# Patient Record
Sex: Female | Born: 1995 | Race: Black or African American | Hispanic: No | Marital: Single | Smoking: Never smoker
Health system: Southern US, Community
[De-identification: ages and names within clinical notes are randomized; demographics above are authoritative.]

## PROBLEM LIST (undated history)

## (undated) ENCOUNTER — Inpatient Hospital Stay (HOSPITAL_COMMUNITY): Payer: Self-pay

## (undated) DIAGNOSIS — E0590 Thyrotoxicosis, unspecified without thyrotoxic crisis or storm: Secondary | ICD-10-CM

## (undated) DIAGNOSIS — N390 Urinary tract infection, site not specified: Secondary | ICD-10-CM

## (undated) DIAGNOSIS — E785 Hyperlipidemia, unspecified: Secondary | ICD-10-CM

## (undated) DIAGNOSIS — I959 Hypotension, unspecified: Secondary | ICD-10-CM

## (undated) DIAGNOSIS — F32A Depression, unspecified: Secondary | ICD-10-CM

## (undated) DIAGNOSIS — R87629 Unspecified abnormal cytological findings in specimens from vagina: Secondary | ICD-10-CM

## (undated) HISTORY — DX: Hyperlipidemia, unspecified: E78.5

## (undated) HISTORY — PX: NO PAST SURGERIES: SHX2092

## (undated) HISTORY — DX: Thyrotoxicosis, unspecified without thyrotoxic crisis or storm: E05.90

## (undated) HISTORY — PX: DILATION AND CURETTAGE OF UTERUS: SHX78

---

## 2016-11-05 ENCOUNTER — Emergency Department (HOSPITAL_BASED_OUTPATIENT_CLINIC_OR_DEPARTMENT_OTHER)
Admission: EM | Admit: 2016-11-05 | Discharge: 2016-11-06 | Disposition: A | Discharge status: Home / Self Care | Payer: Managed Care, Other (non HMO) | Attending: Emergency Medicine | Admitting: Emergency Medicine

## 2016-11-05 ENCOUNTER — Emergency Department (HOSPITAL_BASED_OUTPATIENT_CLINIC_OR_DEPARTMENT_OTHER): Payer: Managed Care, Other (non HMO)

## 2016-11-05 ENCOUNTER — Encounter (HOSPITAL_BASED_OUTPATIENT_CLINIC_OR_DEPARTMENT_OTHER): Payer: Self-pay

## 2016-11-05 DIAGNOSIS — M546 Pain in thoracic spine: Secondary | ICD-10-CM | POA: Diagnosis not present

## 2016-11-05 DIAGNOSIS — S299XXA Unspecified injury of thorax, initial encounter: Secondary | ICD-10-CM | POA: Diagnosis present

## 2016-11-05 DIAGNOSIS — Y92410 Unspecified street and highway as the place of occurrence of the external cause: Secondary | ICD-10-CM | POA: Diagnosis not present

## 2016-11-05 DIAGNOSIS — Y9389 Activity, other specified: Secondary | ICD-10-CM | POA: Diagnosis not present

## 2016-11-05 DIAGNOSIS — Y999 Unspecified external cause status: Secondary | ICD-10-CM | POA: Insufficient documentation

## 2016-11-05 MED ORDER — IBUPROFEN 800 MG PO TABS
800.0000 mg | ORAL_TABLET | Freq: Three times a day (TID) | ORAL | 0 refills | Status: DC
Start: 2016-11-05 — End: 2017-11-06

## 2016-11-05 MED ORDER — METHOCARBAMOL 500 MG PO TABS: 500.0000 mg | ORAL_TABLET | Freq: Two times a day (BID) | ORAL | 0 refills | Status: DC
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
## 2017-10-04 ENCOUNTER — Ambulatory Visit (HOSPITAL_COMMUNITY)
Admission: EM | Admit: 2017-10-04 | Discharge: 2017-10-04 | Disposition: A | Discharge status: Home / Self Care | Payer: Managed Care, Other (non HMO)

## 2017-10-04 NOTE — ED Notes (Signed)
Called x 3 in waiting room no answer 

## 2017-11-06 ENCOUNTER — Encounter: Payer: Self-pay | Admitting: Certified Nurse Midwife

## 2017-11-06 ENCOUNTER — Encounter: Payer: Self-pay | Admitting: *Deleted

## 2017-11-06 ENCOUNTER — Ambulatory Visit (INDEPENDENT_AMBULATORY_CARE_PROVIDER_SITE_OTHER): Payer: Managed Care, Other (non HMO) | Admitting: Certified Nurse Midwife

## 2017-11-06 VITALS — BP 114/74 | HR 81 | Wt 188.0 lb

## 2017-11-06 DIAGNOSIS — Z3400 Encounter for supervision of normal first pregnancy, unspecified trimester: Secondary | ICD-10-CM | POA: Insufficient documentation

## 2017-11-06 DIAGNOSIS — Z113 Encounter for screening for infections with a predominantly sexual mode of transmission: Secondary | ICD-10-CM | POA: Diagnosis not present

## 2017-11-06 DIAGNOSIS — Z1151 Encounter for screening for human papillomavirus (HPV): Secondary | ICD-10-CM | POA: Diagnosis not present

## 2017-11-06 DIAGNOSIS — Z3401 Encounter for supervision of normal first pregnancy, first trimester: Secondary | ICD-10-CM

## 2017-11-06 DIAGNOSIS — Z124 Encounter for screening for malignant neoplasm of cervix: Secondary | ICD-10-CM

## 2017-11-06 DIAGNOSIS — O219 Vomiting of pregnancy, unspecified: Secondary | ICD-10-CM

## 2017-11-06 LAB — POCT URINALYSIS DIPSTICK
Bilirubin, UA: NEGATIVE
Blood, UA: NEGATIVE
Glucose, UA: NEGATIVE
Leukocytes, UA: NEGATIVE
Nitrite, UA: NEGATIVE
Spec Grav, UA: 1.01 (ref 1.010–1.025)
Urobilinogen, UA: 0.2 E.U./dL
pH, UA: 7 (ref 5.0–8.0)

## 2017-11-06 MED ORDER — DOXYLAMINE-PYRIDOXINE ER 20-20 MG PO TBCR: 1.0000 | EXTENDED_RELEASE_TABLET | Freq: Two times a day (BID) | ORAL | 6 refills | Status: DC

## 2017-11-06 NOTE — Progress Notes (Signed)
Pt complains of N&V, constipation.

## 2017-11-06 NOTE — Progress Notes (Signed)
Subjective:   Melony OverlyJanique Marrs is a 22 y.o. G1P0 at 6277w1d by LMP being seen today for her first obstetrical visit.  Her obstetrical history is significant for none. Patient does intend to breast feed. Pregnancy history fully reviewed.  Patient reports nausea, no bleeding, no contractions, no cramping, no leaking, vomiting and constipation.  HISTORY: Obstetric History   G1   P0   T0   P0   A0   L0    SAB0   TAB0   Ectopic0   Multiple0   Live Births0     # Outcome Date GA Lbr Len/2nd Weight Sex Delivery Anes PTL Lv  1 Current               Last pap smear was done unknown.    Past Medical History:  Diagnosis Date  . Hyperlipidemia    Past Surgical History:  Procedure Laterality Date  . NO PAST SURGERIES     History reviewed. No pertinent family history. Social History   Tobacco Use  . Smoking status: Never Smoker  . Smokeless tobacco: Never Used  Substance Use Topics  . Alcohol use: No    Frequency: Never  . Drug use: No   Allergies  Allergen Reactions  . Tamiflu [Oseltamivir Phosphate] Hives   No current outpatient medications on file prior to visit.   No current facility-administered medications on file prior to visit.     Review of Systems Pertinent items noted in HPI and remainder of comprehensive ROS otherwise negative.  Exam   Vitals:   11/06/17 1407  BP: 114/74  Pulse: 81  Weight: 188 lb (85.3 kg)      Uterus:     Pelvic Exam: Perineum: no hemorrhoids, normal perineum   Vulva: normal external genitalia, no lesions   Vagina:  normal mucosa, normal discharge   Cervix: no lesions and normal, pap smear done.    Adnexa: normal adnexa and no mass, fullness, tenderness   Bony Pelvis: average  System: General: well-developed, well-nourished female in no acute distress   Breast:  normal appearance, no masses or tenderness   Skin: normal coloration and turgor, no rashes   Neurologic: oriented, normal, negative, normal mood   Extremities: normal  strength, tone, and muscle mass, ROM of all joints is normal   HEENT PERRLA, extraocular movement intact and sclera clear, anicteric   Mouth/Teeth mucous membranes moist, pharynx normal without lesions and dental hygiene good   Neck supple and no masses   Cardiovascular: regular rate and rhythm   Respiratory:  no respiratory distress, normal breath sounds   Abdomen: soft, non-tender; bowel sounds normal; no masses,  no organomegaly     Assessment:   Pregnancy: G1P0 Patient Active Problem List   Diagnosis Date Noted  . Supervision of normal first pregnancy, antepartum 11/06/2017     Plan:  1. Supervision of normal first pregnancy, antepartum    - Cytology - PAP - Hemoglobinopathy evaluation - VITAMIN D 25 Hydroxy (Vit-D Deficiency, Fractures) - Culture, OB Urine - Hemoglobin A1c - Obstetric Panel, Including HIV - Cervicovaginal ancillary only - POCT urinalysis dipstick - Genetic Screening  2. Nausea/vomiting in pregnancy     - Doxylamine-Pyridoxine ER (BONJESTA) 20-20 MG TBCR; Take 1 tablet by mouth 2 (two) times daily.  Dispense: 60 tablet; Refill: 6   Initial labs drawn. Continue prenatal vitamins. Genetic Screening discussed, NIPS: ordered. Ultrasound discussed; fetal anatomic survey: ordered. Problem list reviewed and updated. The nature of [place] [facility] with multiple MDs and other Advanced Practice Providers was explained to patient; also emphasized that residents, students are part of our team. Routine obstetric precautions reviewed. Return in about 4 weeks (around 12/04/2017) for ROB.     Orvilla Cornwall, CNM [facility] for [employer], [facility]

## 2017-11-07 LAB — CERVICOVAGINAL ANCILLARY ONLY
Bacterial vaginitis: POSITIVE — AB
Candida vaginitis: POSITIVE — AB
Chlamydia: NEGATIVE
Neisseria Gonorrhea: NEGATIVE
Trichomonas: NEGATIVE

## 2017-11-08 ENCOUNTER — Other Ambulatory Visit: Payer: Self-pay | Admitting: Certified Nurse Midwife

## 2017-11-08 LAB — OBSTETRIC PANEL, INCLUDING HIV
Antibody Screen: NEGATIVE
Basophils Absolute: 0 10*3/uL (ref 0.0–0.2)
Basos: 1 %
EOS (ABSOLUTE): 0.1 10*3/uL (ref 0.0–0.4)
Eos: 2 %
HIV Screen 4th Generation wRfx: NONREACTIVE
Hematocrit: 36.9 % (ref 34.0–46.6)
Hemoglobin: 12.6 g/dL (ref 11.1–15.9)
Hepatitis B Surface Ag: NEGATIVE
Immature Grans (Abs): 0 10*3/uL (ref 0.0–0.1)
Immature Granulocytes: 0 %
Lymphocytes Absolute: 2.1 10*3/uL (ref 0.7–3.1)
Lymphs: 39 %
MCH: 31.8 pg (ref 26.6–33.0)
MCHC: 34.1 g/dL (ref 31.5–35.7)
MCV: 93 fL (ref 79–97)
Monocytes Absolute: 0.8 10*3/uL (ref 0.1–0.9)
Monocytes: 14 %
Neutrophils Absolute: 2.5 10*3/uL (ref 1.4–7.0)
Neutrophils: 44 %
Platelets: 249 10*3/uL (ref 150–379)
RBC: 3.96 x10E6/uL (ref 3.77–5.28)
RDW: 13.3 % (ref 12.3–15.4)
RPR Ser Ql: NONREACTIVE
Rh Factor: POSITIVE
Rubella Antibodies, IGG: <0.9 index — ABNORMAL LOW (ref >0.99)
WBC: 5.5 10*3/uL (ref 3.4–10.8)

## 2017-11-08 LAB — HEMOGLOBINOPATHY EVALUATION
HGB C: 0 %
HGB S: 0 %
HGB VARIANT: 0 %
Hemoglobin A2 Quantitation: 2.6 % (ref 1.8–3.2)
Hemoglobin F Quantitation: 0 % (ref 0.0–2.0)
Hgb A: 97.4 % (ref 96.4–98.8)

## 2017-11-08 LAB — CULTURE, OB URINE

## 2017-11-08 LAB — HEMOGLOBIN A1C
Est. average glucose Bld gHb Est-mCnc: 91 mg/dL
Hgb A1c MFr Bld: 4.8 % (ref 4.8–5.6)

## 2017-11-08 LAB — VITAMIN D 25 HYDROXY (VIT D DEFICIENCY, FRACTURES): Vit D, 25-Hydroxy: 12.1 ng/mL — ABNORMAL LOW (ref 30.0–100.0)

## 2017-11-08 LAB — URINE CULTURE, OB REFLEX

## 2017-11-09 LAB — CYTOLOGY - PAP
Diagnosis: NEGATIVE
HPV 16/18/45 genotyping: NEGATIVE
HPV: DETECTED — AB

## 2017-11-15 ENCOUNTER — Telehealth: Payer: Self-pay

## 2017-11-15 NOTE — Telephone Encounter (Signed)
Returned call and patient stated that she needed a new letter so that she would be able to go to clinical rotations, pt stated that she needs letter tomorrow and will try and find a fax number and call back.

## 2017-11-15 NOTE — Telephone Encounter (Signed)
Returned call, no answer, left vm 

## 2017-11-19 ENCOUNTER — Other Ambulatory Visit: Payer: Self-pay | Admitting: Certified Nurse Midwife

## 2017-11-19 DIAGNOSIS — B373 Candidiasis of vulva and vagina: Secondary | ICD-10-CM

## 2017-11-19 DIAGNOSIS — B9689 Other specified bacterial agents as the cause of diseases classified elsewhere: Secondary | ICD-10-CM

## 2017-11-19 DIAGNOSIS — E559 Vitamin D deficiency, unspecified: Secondary | ICD-10-CM | POA: Insufficient documentation

## 2017-11-19 DIAGNOSIS — N760 Acute vaginitis: Principal | ICD-10-CM

## 2017-11-19 DIAGNOSIS — Z283 Underimmunization status: Secondary | ICD-10-CM | POA: Insufficient documentation

## 2017-11-19 DIAGNOSIS — Z3400 Encounter for supervision of normal first pregnancy, unspecified trimester: Secondary | ICD-10-CM

## 2017-11-19 DIAGNOSIS — O9989 Other specified diseases and conditions complicating pregnancy, childbirth and the puerperium: Secondary | ICD-10-CM

## 2017-11-19 DIAGNOSIS — B3731 Acute candidiasis of vulva and vagina: Secondary | ICD-10-CM

## 2017-11-19 DIAGNOSIS — O09899 Supervision of other high risk pregnancies, unspecified trimester: Secondary | ICD-10-CM

## 2017-11-19 DIAGNOSIS — Z2839 Other underimmunization status: Secondary | ICD-10-CM | POA: Insufficient documentation

## 2017-11-19 MED ORDER — TERCONAZOLE 0.8 % VA CREA: 1.0000 | TOPICAL_CREAM | Freq: Every day | VAGINAL | 0 refills | Status: DC

## 2017-11-19 MED ORDER — METRONIDAZOLE 0.75 % VA GEL: 1.0000 | Freq: Two times a day (BID) | VAGINAL | 0 refills | Status: DC

## 2017-11-19 MED ORDER — VITAMIN D (ERGOCALCIFEROL) 1.25 MG (50000 UNIT) PO CAPS: 50000.0000 [IU] | ORAL_CAPSULE | ORAL | 2 refills | Status: DC

## 2017-12-04 ENCOUNTER — Ambulatory Visit (INDEPENDENT_AMBULATORY_CARE_PROVIDER_SITE_OTHER): Payer: Managed Care, Other (non HMO) | Admitting: Certified Nurse Midwife

## 2017-12-04 ENCOUNTER — Encounter: Payer: Self-pay | Admitting: Certified Nurse Midwife

## 2017-12-04 VITALS — BP 128/74 | HR 75 | Wt 185.0 lb

## 2017-12-04 DIAGNOSIS — Z2839 Other underimmunization status: Secondary | ICD-10-CM

## 2017-12-04 DIAGNOSIS — Z3402 Encounter for supervision of normal first pregnancy, second trimester: Secondary | ICD-10-CM

## 2017-12-04 DIAGNOSIS — Z283 Underimmunization status: Secondary | ICD-10-CM

## 2017-12-04 DIAGNOSIS — E559 Vitamin D deficiency, unspecified: Secondary | ICD-10-CM

## 2017-12-04 DIAGNOSIS — Z3400 Encounter for supervision of normal first pregnancy, unspecified trimester: Secondary | ICD-10-CM

## 2017-12-04 DIAGNOSIS — O9989 Other specified diseases and conditions complicating pregnancy, childbirth and the puerperium: Secondary | ICD-10-CM

## 2017-12-04 NOTE — Progress Notes (Signed)
   PRENATAL VISIT NOTE  Subjective:  Margaret Goodman is a 22 y.o. G1P0 at 109w1dbeing seen today for ongoing prenatal care.  She is currently monitored for the following issues for this low-risk pregnancy and has Supervision of normal first pregnancy, antepartum; Vitamin D deficiency; and Rubella non-immune status, antepartum on their problem list.  Patient reports no bleeding, no contractions, no cramping, no leaking and allergy symptoms: OTC medications discussed.  Contractions: Not present. Vag. Bleeding: None.  Movement: Absent. Denies leaking of fluid.   The following portions of the patient's history were reviewed and updated as appropriate: allergies, current medications, past family history, past medical history, past social history, past surgical history and problem list. Problem list updated.  Objective:   Vitals:   12/04/17 1459  BP: 128/74  Pulse: 75  Weight: 185 lb (83.9 kg)    Fetal Status: Fetal Heart Rate (bpm): 158; doppler   Movement: Absent     General:  Alert, oriented and cooperative. Patient is in no acute distress.  Skin: Skin is warm and dry. No rash noted.   Cardiovascular: Normal heart rate noted  Respiratory: Normal respiratory effort, no problems with respiration noted  Abdomen: Soft, gravid, appropriate for gestational age.  Pain/Pressure: Present     Pelvic: Cervical exam deferred        Extremities: Normal range of motion.  Edema: None  Mental Status: Normal mood and affect. Normal behavior. Normal judgment and thought content.   Assessment and Plan:  Pregnancy: G1P0 at 118w1d1. Supervision of normal first pregnancy, antepartum     Doing well.  Allergies: mild.  OTC medications discussed/list given.  Anatomy [place]ore[place] 12/31/17.  - AFP, Serum, Open Spina Bifida  2. Vitamin D deficiency     Taking weekly vitamin D  3. Rubella non-immune status, antepartum     MMR postpartum  Preterm labor symptoms and general obstetric precautions including but  not limited to vaginal bleeding, contractions, leaking of fluid and fetal movement were reviewed in detail with the patient. Please refer to After Visit Summary for other counseling recommendations.  Return in about 1 month (around 01/01/2018) for [place] Future Appointments  Date Time Provider [place]5/13/2019  2:15 PM [place] Alaynah Schutter, CNM

## 2017-12-04 NOTE — Progress Notes (Signed)
Pt states she has had episodes of feeling faint, dizzy possibly dehydrated.   Pt needs a different PNV's

## 2017-12-06 LAB — AFP, SERUM, OPEN SPINA BIFIDA
AFP MoM: 1.39
AFP Value: 35.6 ng/mL
Gest. Age on Collection Date: 15.1 weeks
Maternal Age At EDD: 22.6 yr
OSBR Risk 1 IN: 7407
Test Results:: NEGATIVE
Weight: 185 [lb_av]

## 2017-12-11 ENCOUNTER — Encounter: Payer: Self-pay | Admitting: *Deleted

## 2017-12-11 ENCOUNTER — Other Ambulatory Visit: Payer: Self-pay | Admitting: Certified Nurse Midwife

## 2017-12-11 DIAGNOSIS — Z3400 Encounter for supervision of normal first pregnancy, unspecified trimester: Secondary | ICD-10-CM

## 2017-12-31 ENCOUNTER — Other Ambulatory Visit: Payer: Self-pay | Admitting: Certified Nurse Midwife

## 2017-12-31 ENCOUNTER — Ambulatory Visit (HOSPITAL_COMMUNITY)
Admission: RE | Admit: 2017-12-31 | Discharge: 2017-12-31 | Disposition: A | Discharge status: Home / Self Care | Payer: Managed Care, Other (non HMO) | Source: Ambulatory Visit | Attending: Certified Nurse Midwife | Admitting: Certified Nurse Midwife

## 2017-12-31 DIAGNOSIS — Z3A19 19 weeks gestation of pregnancy: Secondary | ICD-10-CM

## 2017-12-31 DIAGNOSIS — Z3400 Encounter for supervision of normal first pregnancy, unspecified trimester: Secondary | ICD-10-CM

## 2017-12-31 DIAGNOSIS — Z363 Encounter for antenatal screening for malformations: Secondary | ICD-10-CM

## 2018-01-01 ENCOUNTER — Other Ambulatory Visit: Payer: Self-pay | Admitting: Certified Nurse Midwife

## 2018-01-01 DIAGNOSIS — Z3400 Encounter for supervision of normal first pregnancy, unspecified trimester: Secondary | ICD-10-CM

## 2018-01-04 ENCOUNTER — Encounter: Payer: Managed Care, Other (non HMO) | Admitting: Certified Nurse Midwife

## 2018-01-11 ENCOUNTER — Encounter: Payer: Self-pay | Admitting: Certified Nurse Midwife

## 2018-01-11 ENCOUNTER — Ambulatory Visit (INDEPENDENT_AMBULATORY_CARE_PROVIDER_SITE_OTHER): Payer: Managed Care, Other (non HMO) | Admitting: Certified Nurse Midwife

## 2018-01-11 VITALS — BP 105/67 | HR 71 | Wt 191.0 lb

## 2018-01-11 DIAGNOSIS — O9989 Other specified diseases and conditions complicating pregnancy, childbirth and the puerperium: Secondary | ICD-10-CM

## 2018-01-11 DIAGNOSIS — Z283 Underimmunization status: Secondary | ICD-10-CM

## 2018-01-11 DIAGNOSIS — Z2839 Other underimmunization status: Secondary | ICD-10-CM

## 2018-01-11 DIAGNOSIS — Z3402 Encounter for supervision of normal first pregnancy, second trimester: Secondary | ICD-10-CM

## 2018-01-11 DIAGNOSIS — O09899 Supervision of other high risk pregnancies, unspecified trimester: Secondary | ICD-10-CM

## 2018-01-11 DIAGNOSIS — E559 Vitamin D deficiency, unspecified: Secondary | ICD-10-CM

## 2018-01-11 DIAGNOSIS — Z3400 Encounter for supervision of normal first pregnancy, unspecified trimester: Secondary | ICD-10-CM

## 2018-01-11 NOTE — Progress Notes (Signed)
Patient reports fetal movement, denies pain. 

## 2018-01-11 NOTE — Progress Notes (Signed)
   PRENATAL VISIT NOTE  Subjective:  Margaret Goodman is a 22 y.o. G1P0 at 20w4dbeing seen today for ongoing prenatal care.  She is currently monitored for the following issues for this low-risk pregnancy and has Supervision of normal first pregnancy, antepartum; Vitamin D deficiency; and Rubella non-immune status, antepartum on their problem list.  Patient reports no complaints.  Contractions: Not present. Vag. Bleeding: None.  Movement: Present. Denies leaking of fluid.   The following portions of the patient's history were reviewed and updated as appropriate: allergies, current medications, past family history, past medical history, past social history, past surgical history and problem list. Problem list updated.  Objective:   Vitals:   01/11/18 0825  BP: 105/67  Pulse: 71  Weight: 191 lb (86.6 kg)    Fetal Status: Fetal Heart Rate (bpm): 152; doppler Fundal Height: 20 cm Movement: Present     General:  Alert, oriented and cooperative. Patient is in no acute distress.  Skin: Skin is warm and dry. No rash noted.   Cardiovascular: Normal heart rate noted  Respiratory: Normal respiratory effort, no problems with respiration noted  Abdomen: Soft, gravid, appropriate for gestational age.  Pain/Pressure: Absent     Pelvic: Cervical exam deferred        Extremities: Normal range of motion.  Edema: None  Mental Status: Normal mood and affect. Normal behavior. Normal judgment and thought content.   Assessment and Plan:  Pregnancy: G1P0 at 281w4d1. Supervision of normal first pregnancy, antepartum     Doing well.  Has f/u anatomy scan scheduled for 02/01/18.    2. Vitamin D deficiency     Taking weekly vitamin D  3. Rubella non-immune status, antepartum     MMR postpartum  Preterm labor symptoms and general obstetric precautions including but not limited to vaginal bleeding, contractions, leaking of fluid and fetal movement were reviewed in detail with the patient. Please refer to  After Visit Summary for other counseling recommendations.  Return in about 1 month (around 02/08/2018) for [place] Future Appointments  Date Time Provider [place]6/14/2019  3:15 PM [facility] [place] Labrina Lines, CNM

## 2018-02-01 ENCOUNTER — Ambulatory Visit (HOSPITAL_COMMUNITY)
Admission: RE | Admit: 2018-02-01 | Discharge: 2018-02-01 | Disposition: A | Discharge status: Home / Self Care | Payer: Managed Care, Other (non HMO) | Source: Ambulatory Visit | Attending: Certified Nurse Midwife | Admitting: Certified Nurse Midwife

## 2018-02-01 DIAGNOSIS — Z3A23 23 weeks gestation of pregnancy: Secondary | ICD-10-CM | POA: Insufficient documentation

## 2018-02-01 DIAGNOSIS — Z3402 Encounter for supervision of normal first pregnancy, second trimester: Secondary | ICD-10-CM | POA: Diagnosis not present

## 2018-02-01 DIAGNOSIS — Z3400 Encounter for supervision of normal first pregnancy, unspecified trimester: Secondary | ICD-10-CM

## 2018-02-05 ENCOUNTER — Other Ambulatory Visit: Payer: Self-pay | Admitting: Certified Nurse Midwife

## 2018-02-05 DIAGNOSIS — Z3400 Encounter for supervision of normal first pregnancy, unspecified trimester: Secondary | ICD-10-CM

## 2018-02-08 ENCOUNTER — Encounter: Payer: Managed Care, Other (non HMO) | Admitting: Certified Nurse Midwife

## 2018-02-15 ENCOUNTER — Ambulatory Visit (INDEPENDENT_AMBULATORY_CARE_PROVIDER_SITE_OTHER): Payer: Managed Care, Other (non HMO) | Admitting: Certified Nurse Midwife

## 2018-02-15 ENCOUNTER — Encounter: Payer: Self-pay | Admitting: Certified Nurse Midwife

## 2018-02-15 VITALS — BP 120/75 | HR 68 | Wt 194.2 lb

## 2018-02-15 DIAGNOSIS — Z3400 Encounter for supervision of normal first pregnancy, unspecified trimester: Secondary | ICD-10-CM

## 2018-02-15 DIAGNOSIS — O9989 Other specified diseases and conditions complicating pregnancy, childbirth and the puerperium: Secondary | ICD-10-CM

## 2018-02-15 DIAGNOSIS — M25559 Pain in unspecified hip: Secondary | ICD-10-CM

## 2018-02-15 DIAGNOSIS — O99612 Diseases of the digestive system complicating pregnancy, second trimester: Secondary | ICD-10-CM

## 2018-02-15 DIAGNOSIS — Z2839 Other underimmunization status: Secondary | ICD-10-CM

## 2018-02-15 DIAGNOSIS — K219 Gastro-esophageal reflux disease without esophagitis: Secondary | ICD-10-CM

## 2018-02-15 DIAGNOSIS — O26892 Other specified pregnancy related conditions, second trimester: Secondary | ICD-10-CM

## 2018-02-15 DIAGNOSIS — Z3402 Encounter for supervision of normal first pregnancy, second trimester: Secondary | ICD-10-CM

## 2018-02-15 DIAGNOSIS — Z283 Underimmunization status: Secondary | ICD-10-CM

## 2018-02-15 DIAGNOSIS — E559 Vitamin D deficiency, unspecified: Secondary | ICD-10-CM

## 2018-02-15 MED ORDER — VITAMIN D (ERGOCALCIFEROL) 1.25 MG (50000 UNIT) PO CAPS: 50000.0000 [IU] | ORAL_CAPSULE | ORAL | 2 refills | Status: DC

## 2018-02-15 MED ORDER — COMFORT FIT MATERNITY SUPP LG MISC: 1.0000 [IU] | Freq: Every day | 0 refills | Status: DC

## 2018-02-15 MED ORDER — OMEPRAZOLE 20 MG PO CPDR: 20.0000 mg | DELAYED_RELEASE_CAPSULE | Freq: Two times a day (BID) | ORAL | 5 refills | Status: DC

## 2018-02-15 NOTE — Progress Notes (Signed)
   PRENATAL VISIT NOTE  Subjective:  Margaret Goodman is a 22 y.o. G1P0 at 48w4dbeing seen today for ongoing prenatal care.  She is currently monitored for the following issues for this low-risk pregnancy and has Supervision of normal first pregnancy, antepartum; Vitamin D deficiency; and Rubella non-immune status, antepartum on their problem list.  Patient reports backache, no bleeding, no contractions, no cramping, no leaking and left hip pain is worse with working.  Contractions: Not present. Vag. Bleeding: None.  Movement: Present. Denies leaking of fluid.   The following portions of the patient's history were reviewed and updated as appropriate: allergies, current medications, past family history, past medical history, past social history, past surgical history and problem list. Problem list updated.  Objective:   Vitals:   02/15/18 0901  BP: 120/75  Pulse: 68  Weight: 194 lb 3.2 oz (88.1 kg)    Fetal Status: Fetal Heart Rate (bpm): 143; doppler Fundal Height: 24 cm Movement: Present     General:  Alert, oriented and cooperative. Patient is in no acute distress.  Skin: Skin is warm and dry. No rash noted.   Cardiovascular: Normal heart rate noted  Respiratory: Normal respiratory effort, no problems with respiration noted  Abdomen: Soft, gravid, appropriate for gestational age.  Pain/Pressure: Present     Pelvic: Cervical exam deferred        Extremities: Normal range of motion.  Edema: None  Mental Status: Normal mood and affect. Normal behavior. Normal judgment and thought content.   Assessment and Plan:  Pregnancy: G1P0 at 242w4d1. Supervision of normal first pregnancy, antepartum     Left hip pain, Homeopathic remedies discussed  2. Rubella non-immune status, antepartum     MMR postpartum  3. Vitamin D deficiency      - Vitamin D, Ergocalciferol, (DRISDOL) 50000 units CAPS capsule; Take 1 capsule (50,000 Units total) by mouth every 7 (seven) days.  Dispense: 30  capsule; Refill: 2  4. Pregnancy related hip pain in second trimester, antepartum      - Elastic Bandages & Supports (COMFORT FIT MATERNITY SUPP LG) MISC; 1 Units by Does not apply route daily.  Dispense: 1 each; Refill: 0  5. Gastroesophageal reflux during pregnancy in second trimester, antepartum     OTC TUMS recommended, diet discussed.  - omeprazole (PRILOSEC) 20 MG capsule; Take 1 capsule (20 mg total) by mouth 2 (two) times daily before a meal.  Dispense: 60 capsule; Refill: 5  Preterm labor symptoms and general obstetric precautions including but not limited to vaginal bleeding, contractions, leaking of fluid and fetal movement were reviewed in detail with the patient. Please refer to After Visit Summary for other counseling recommendations.  Return in about 2 weeks (around 03/01/2018) for ROB, 2 hr OGTT.  Future Appointments  Date Time Provider [place]7/07/2018  8:00 AM [facility] None  03/01/2018  8:30 AM [place], TeRona RavensNP CWM[place]    RaMorene CrockerCNM

## 2018-03-01 ENCOUNTER — Ambulatory Visit (INDEPENDENT_AMBULATORY_CARE_PROVIDER_SITE_OTHER): Payer: Managed Care, Other (non HMO) | Admitting: Nurse Practitioner

## 2018-03-01 ENCOUNTER — Other Ambulatory Visit: Payer: Managed Care, Other (non HMO)

## 2018-03-01 DIAGNOSIS — Z3403 Encounter for supervision of normal first pregnancy, third trimester: Secondary | ICD-10-CM

## 2018-03-01 DIAGNOSIS — Z3400 Encounter for supervision of normal first pregnancy, unspecified trimester: Secondary | ICD-10-CM

## 2018-03-01 MED ORDER — VITAFOL GUMMIES 3.33-0.333-34.8 MG PO CHEW: 3.0000 | CHEWABLE_TABLET | Freq: Every day | ORAL | 11 refills | Status: DC

## 2018-03-01 NOTE — Progress Notes (Signed)
    Subjective:  Margaret Goodman is a 22 y.o. G1P0 at 2739w4d being seen today for ongoing prenatal care.  She is currently monitored for the following issues for this low-risk pregnancy and has Supervision of normal first pregnancy, antepartum; Vitamin D deficiency; and Rubella non-immune status, antepartum on their problem list.  Patient reports no complaints.  Contractions: Irregular. Vag. Bleeding: None.  Movement: Present. Denies leaking of fluid.   The following portions of the patient's history were reviewed and updated as appropriate: allergies, current medications, past family history, past medical history, past social history, past surgical history and problem list. Problem list updated.  Objective:   Vitals:   03/01/18 0825  BP: 99/65  Pulse: 92  Weight: 198 lb 1.6 oz (89.9 kg)    Fetal Status: Fetal Heart Rate (bpm): 145; doppler Fundal Height: 29 cm Movement: Present     General:  Alert, oriented and cooperative. Patient is in no acute distress.  Skin: Skin is warm and dry. No rash noted.   Cardiovascular: Normal heart rate noted  Respiratory: Normal respiratory effort, no problems with respiration noted  Abdomen: Soft, gravid, appropriate for gestational age. Pain/Pressure: Present     Pelvic:  Cervical exam deferred        Extremities: Normal range of motion.  Edema: None  Mental Status: Normal mood and affect. Normal behavior. Normal judgment and thought content.   Urinalysis:      Assessment and Plan:  Pregnancy: G1P0 at 6839w4d  1. Supervision of normal first pregnancy, antepartum Hip pain has improved but is still present at night and she has to move from side to side in the bed.  Advised to use a pillow between her knees at night. Advised to sign up for Baby Scripts App for information and to sign up for childbirth and breastfeeding classes. Taking meds for reflux and it is being managed well. Prescribed prescription gummy PNV.  Currently taking over the  counter.  - Glucose Tolerance, 2 Hours w/1 Hour - CBC - HIV antibody - RPR  Preterm labor symptoms and general obstetric precautions including but not limited to vaginal bleeding, contractions, leaking of fluid and fetal movement were reviewed in detail with the patient. Please refer to After Visit Summary for other counseling recommendations.  Return in about 2 weeks (around 03/15/2018).  Nolene BernheimERRI Genetta Fiero, RN, MSN, NP-BC Nurse Practitioner, [facility] [employer], [facility] 03/01/2018 8:53 AM

## 2018-03-01 NOTE — Patient Instructions (Addendum)
Childbirth Education Options: [facility] Department Classes:  Childbirth education classes can help you get ready for a positive parenting experience. You can also meet other expectant parents and get free stuff for your baby. Each class runs for five weeks on the same night and costs $45 for the mother-to-be and her support person. Medicaid covers the cost if you are eligible. Call [phone] to register. [facility] Childbirth Education:  [phone] or [phone] or [email or web address]  Baby & Me Class: Discuss newborn & infant parenting and family adjustment issues with other new mothers in a relaxed environment. Each week brings a new speaker or baby-centered activity. We encourage new mothers to join [place] every Thursday at 11:00am. Babies birth until crawling. No registration or fee. Daddy [employer]: This course offers Dads-to-be the tools and knowledge needed to feel confident on their journey to becoming new fathers. Experienced dads, who have been trained as coaches, teach dads-to-be how to hold, comfort, diaper, swaddle and play with their infant while being able to support the new mom as well. A class for men taught by men. $25/dad Big Brother/Big Sister: Let your children share in the joy of a new brother or sister in this special class designed just for them. Class includes discussion about how families care for babies: swaddling, holding, diapering, safety as well as how they can be helpful in their new role. This class is designed for children ages 45 to 48, but any age is welcome. Please register each child individually. $5/child  Mom Talk: This mom-led group offers support and connection to mothers as they journey through the adjustments and struggles of that sometimes overwhelming first year after the birth of a child. Tuesdays at 10:00am and Thursdays at 6:00pm. Babies welcome. No registration or fee. Breastfeeding Support Group: This group is a mother-to-mother  support circle where moms have the opportunity to share their breastfeeding experiences. A Lactation Consultant is present for questions and concerns. Meets each Tuesday at 11:00am. No fee or registration. Breastfeeding Your Baby: Learn what to expect in the first days of breastfeeding your newborn.  This class will help you feel more confident with the skills needed to begin your breastfeeding experience. Many new mothers are concerned about breastfeeding after leaving the hospital. This class will also address the most common fears and challenges about breastfeeding during the first few weeks, months and beyond. (call for fee) Comfort Techniques and Tour: This 2 hour interactive class will provide you the opportunity to learn & practice hands-on techniques that can help relieve some of the discomfort of labor and encourage your baby to rotate toward the best position for birth. You and your partner will be able to try a variety of labor positions with birth balls and rebozos as well as practice breathing, relaxation, and visualization techniques. A tour of the [facility] is included with this class. $20 per registrant and support person Childbirth Class- Weekend Option: This class is a Weekend version of our Birth & Baby series. It is designed for parents who have a difficult time fitting several weeks of classes into their schedule. It covers the care of your newborn and the basics of labor and childbirth. It also includes a [place] of [facility] and lunch. The class is held two consecutive days: beginning on Friday evening from 6:30 - 8:30 p.m. and the next day, Saturday from 9 a.m. - 4 p.m. (call for fee) Doren Custard Class: Interested in a waterbirth?  This  informational class will help you discover whether waterbirth is the right fit for you. Education about waterbirth itself, supplies you would need and how to assemble your support team is what you can  expect from this class. Some obstetrical practices require this class in order to pursue a waterbirth. (Not all obstetrical practices offer waterbirth-check with your healthcare provider.) Register only the expectant mom, but you are encouraged to bring your partner to class! Required if planning waterbirth, no fee. Infant/Child CPR: Parents, grandparents, babysitters, and friends learn Cardio-Pulmonary Resuscitation skills for infants and children. You will also learn how to treat both conscious and unconscious choking in infants and children. This Family & Friends program does not offer certification. Register each participant individually to ensure that enough mannequins are available. (Call for fee) Grandparent Love: Expecting a grandbaby? This class is for you! Learn about the latest infant care and safety recommendations and ways to support your own child as he or she transitions into the parenting role. Taught by Registered Nurses who are childbirth instructors, but most importantly...they are grandmothers too! $10/person. Childbirth Class- Natural Childbirth: This series of 5 weekly classes is for expectant parents who want to learn and practice natural methods of coping with the process of labor and childbirth. Relaxation, breathing, massage, visualization, role of the partner, and helpful positioning are highlighted. Participants learn how to be confident in their body's ability to give birth. This class will empower and help parents make informed decisions about their own care. Includes discussion that will help new parents transition into the immediate postpartum period. Maternity Care Center Tour of [facility] is included. We suggest taking this class between 25-32 weeks, but it's only a recommendation. $75 per registrant and one support person or $30 Medicaid. Childbirth Class- 3 week Series: This option of 3 weekly classes helps you and your labor partner prepare for childbirth. Newborn  care, labor & birth, cesarean birth, pain management, and comfort techniques are discussed and a Maternity Care Center Tour of [facility] is included. The class meets at the same time, on the same day of the week for 3 consecutive weeks beginning with the starting date you choose. $60 for registrant and one support person.  Marvelous Multiples: Expecting twins, triplets, or more? This class covers the differences in labor, birth, parenting, and breastfeeding issues that face multiples' parents. NICU tour is included. Led by a Certified Childbirth Educator who is the mother of twins. No fee. Caring for Baby: This class is for expectant and adoptive parents who want to learn and practice the most up-to-date newborn care for their babies. Focus is on birth through the first six weeks of life. Topics include feeding, bathing, diapering, crying, umbilical cord care, circumcision care and safe sleep. Parents learn to recognize symptoms of illness and when to call the pediatrician. Register only the mom-to-be and your partner or support person can plan to come with you! $10 per registrant and support person Childbirth Class- online option: This online class offers you the freedom to complete a Birth and Baby series in the comfort of your own home. The flexibility of this option allows you to review sections at your own pace, at times convenient to you and your support people. It includes additional video information, animations, quizzes, and extended activities. Get organized with helpful eClass tools, checklists, and trackers. Once you register online for the class, you will receive an email within a few days to accept the invitation and begin the class when the time   is right for you. The content will be available to you for 60 days. $60 for 60 days of online access for you and your support people.  Local Doulas: Natural Baby Doulas [email or web address] Tel:  [phone] [email or web address] [employer] [phone] [email or web address] The Labor Hassell Halim  (also do waterbirth tub rental) [phone] [email or web address] [email or web address] Birth Doula [phone] [email or web address] Sacred Rhythms  [phone] [email or web address] [employer] (PADA) [email or web address] [place] Birth and Baby  [email or web address] Considering Waterbirth? Guide for patients at [facility] [employer]  Why consider waterbirth?  . Gentle birth for babies . Less pain medicine used in labor . May allow for passive descent/less pushing . May reduce perineal tears  . More mobility and instinctive maternal position changes . Increased maternal relaxation . Reduced blood pressure in labor  Is waterbirth safe? What are the risks of infection, drowning or other complications?  . Infection: o Very low risk (3.7 % for tub vs 4.8% for bed) o 7 in 8000 waterbirths with documented infection o Poorly cleaned equipment most common cause o Slightly lower group B strep transmission rate  . Drowning o Maternal:  - Very low risk   - Related to seizures or fainting o Newborn:  - Very low risk. No evidence of increased risk of respiratory problems in multiple large studies - Physiological protection from breathing under water - Avoid underwater birth if there are any fetal complications - Once baby's head is out of the water, keep it out.  . Birth complication o Some reports of cord trauma, but risk decreased by bringing baby to surface gradually o No evidence of increased risk of shoulder dystocia. Mothers can usually change positions faster in water than in a bed, possibly aiding the maneuvers to free the shoulder.   You must attend a Doren Custard class at [facility]  3rd Wednesday of every month from 7-9pm  [employer] by calling [phone] or online at [email or web address]  Bring [place] the certificate from the class to your prenatal appointment  Meet with a midwife at 36 weeks to see if you can still plan a waterbirth and to sign the consent.   Purchase or rent the following supplies:   Water Birth Pool (Birth Pool in a Box or [place] for instance)  (Tubs start ~$125)  Single-use disposable tub liner designed for your brand of tub  New garden hose labeled "lead-free", "suitable for drinking water",  Electric drain pump to remove water (We recommend 792 gallon per hour or greater pump.)   Separate garden hose to remove the dirty water  Fish net  Bathing suit top (optional)  Long-handled mirror (optional)  Places to purchase or rent supplies  [email or web address] for tub purchases and supplies  Waterbirthsolutions.com for tub purchases and supplies  The Labor Ladies ([email or web address]) $275 for tub rental/set-up & take down/kit   [employer] (http://www.fleming.[email or web address]) Information regarding doulas (labor support) who provide pool rentals  Our practice has a Birth Pool in a Box tub at the hospital that you may borrow on a first-come-first-served basis. It is your responsibility to to set up, clean and break down the tub. We cannot guarantee the availability of this tub in advance. You are responsible for bringing all accessories listed above. If you do not have all necessary supplies you cannot have a waterbirth.    Things that would prevent you from having a waterbirth:  Premature, <37wks  Previous cesarean birth  Presence of thick meconium-stained fluid  Multiple gestation (Twins,  triplets, etc.)  Uncontrolled diabetes or gestational diabetes requiring medication  Hypertension requiring medication or diagnosis of pre-eclampsia  Heavy vaginal bleeding  Non-reassuring fetal  heart rate  Active infection (MRSA, etc.). Group B Strep is NOT a contraindication for  waterbirth.  If your labor has to be induced and induction method requires continuous  monitoring of the baby's heart rate  Other risks/issues identified by your obstetrical provider  Please remember that birth is unpredictable. Under certain unforeseeable circumstances your provider may advise against giving birth in the tub. These decisions will be made on a case-by-case basis and with the safety of you and your baby as our highest priority.  BENEFITS OF BREASTFEEDING Many women wonder if they should breastfeed. Research shows that breast milk contains the perfect balance of vitamins, protein and fat that your baby needs to grow. It also contains antibodies that help your baby's immune system to fight off viruses and bacteria and can reduce the risk of sudden infant death syndrome (SIDS). In addition, the colostrum (a fluid secreted from the breast in the first few days after delivery) helps your newborn's digestive system to grow and function well. Breast milk is easier to digest than formula. Also, if your baby is born preterm, breast milk can help to reduce both short- and long-term health problems. BENEFITS OF BREASTFEEDING FOR MOM . Breastfeeding causes a hormone to be released that helps the uterus to contract and return to its normal size more quickly. . It aids in postpartum weight loss, reduces risk of breast and ovarian cancer, heart disease and rheumatoid arthritis. . It decreases the amount of bleeding after the baby is born. benefits of breastfeeding for baby . Provides comfort and nutrition . Protects baby against - Obesity - Diabetes - Asthma - Childhood cancers - Heart disease - Ear infections - Diarrhea - Pneumonia - Stomach problems - Serious allergies - Skin rashes . Promotes growth and development . Reduces the risk of baby having Sudden Infant Death Syndrome (SIDS) only  breastmilk for the first 6 months . Protects baby against diseases/allergies . It's the perfect amount for tiny bellies . It restores baby's energy . Provides the best nutrition for baby . Giving water or formula can make baby more likely to get sick, decrease Mom's milk supply, make baby less content with breastfeeding Skin to Skin After delivery, the staff will place your baby on your chest. This helps with the following: . Regulates baby's temperature, breathing, heart rate and blood sugar . Increases Mom's milk supply . Promotes bonding . Keeps baby and Mom calm and decreases baby's crying Rooming In Your baby will stay in your room with you for the entire time you are in the hospital. This helps with the following: . Allows Mom to learn baby's feeding cues - Fluttering eyes - Sucking on tongue or hand - Rooting (opens mouth and turns head) - Nuzzling into the breast - Bringing hand to mouth . Allows breastfeeding on demand (when your baby is ready) . Helps baby to be calm and content . Ensures a good milk supply . Prevents complications with breastfeeding . Allows parents to learn to care for baby . Allows you to request assistance with breastfeeding Importance of a good latch . Increases milk transfer to baby - baby gets enough milk . Ensures you have enough milk for your baby . Decreases nipple soreness . Don't use pacifiers and bottles - these cause baby to suck differently than breastfeeding . Promotes continuation of breastfeeding   Risks of Formula Supplementation with Breastfeeding Giving your infant formula in addition to your breast-milk EXCEPT when medically necessary can lead to: . Decreases your milk supply  . Loss of confidence in yourself for providing baby's nutrition  . Engorgement and possibly mastitis  . Asthma & allergies in the baby BREASTFEEDING FAQS How long should I breastfeed my baby? It is recommended that you provide your baby with breast milk only  for the first 6 months and then continue for the first year and longer as desired. During the first few weeks after birth, your baby will need to feed 8-12 times every 24 hours, or every 2-3 hours. They will likely feed for 15-30 minutes. How can I help my baby begin breastfeeding? Babies are born with an instinct to breastfeed. A healthy baby can begin breastfeeding right away without specific help. At the hospital, a nurse (or lactation consultant) will help you begin the process and will give you tips on good positioning. It may be helpful to take a breastfeeding class before you deliver in order to know what to expect. How can I help my baby latch on? In order to assist your baby in latching-on, cup your breast in your hand and stroke your baby's lower lip with your nipple to stimulate your baby's rooting reflex. Your baby will look like he or she is yawning, at which point you should bring the baby towards your breast, while aiming the nipple at the roof of his or her mouth. Remember to bring the baby towards you and not your breast towards the baby. How can I tell if my baby is latched-on? Your baby will have all of your nipple and part of the dark area around the nipple in his or her mouth and your baby's nose will be touching your breast. You should see or hear the baby swallowing. If the baby is not latched-on properly, start the process over. To remove the suction, insert a clean finger between your breast and the baby's mouth. Should I switch breasts during feeding? After feeding on one side, switch the baby to your other breast. If he or she does not continue feeding - that is OK. Your baby will not necessarily need to feed from both breasts in a single feeding. On the next feeding, start with the other breast for efficiency and comfort. How can I tell if my baby is hungry? When your baby is hungry, they will nuzzle against your breast, make sucking noises and tongue motions and may put their  hands near their mouth. Crying is a late sign of hunger, so you should not wait until this point. When they have received enough milk, they will unlatch from the breast. Is it okay to use a pacifier? Until your baby gets the hang of breastfeeding, experts recommend limiting pacifier usage. If you have questions about this, please contact your pediatrician. What can I do to ensure proper nutrition while breastfeeding? . Make sure that you support your own health and your baby's by eating a healthy, well-balanced diet . Your provider may recommend that you continue to take your prenatal vitamin . Drink plenty of fluids. It is a good rule to drink one glass of water before or after feeding . Alcohol will remain in the breast milk for as long as it will remain in the blood stream. If you choose to have a drink, it is recommended that you wait at least 2 hours before feeding . Moderate amounts of caffeine are   are OK . Some over-the-counter or prescription medications are not recommended during breastfeeding. Check with your provider if you have questions What types of birth control methods are safe while breastfeeding? Progestin-only methods, including a daily pill, an IUD, the implant and the injection are safe while breastfeeding. Methods that contain estrogen (such as combination birth control pills, the vaginal ring and the patch) should not be used during the first month of breastfeeding as these can decrease your milk supply.    Third Trimester of Pregnancy The third trimester is from week 29 through week 42, months 7 through 9. This trimester is when your unborn baby (fetus) is growing very fast. At the end of the ninth month, the unborn baby is about 20 inches in length. It weighs about 6-10 pounds. Follow these instructions at home:  Avoid all smoking, herbs, and alcohol. Avoid drugs not approved by your doctor.  Do not use any tobacco products, including cigarettes, chewing tobacco, and  electronic cigarettes. If you need help quitting, ask your doctor. You may get counseling or other support to help you quit.  Only take medicine as told by your doctor. Some medicines are safe and some are not during pregnancy.  Exercise only as told by your doctor. Stop exercising if you start having cramps.  Eat regular, healthy meals.  Wear a good support bra if your breasts are tender.  Do not use hot tubs, steam rooms, or saunas.  Wear your seat belt when driving.  Avoid raw meat, uncooked cheese, and liter boxes and soil used by cats.  Take your prenatal vitamins.  Take 1500-2000 milligrams of calcium daily starting at the 20th week of pregnancy until you deliver your baby.  Try taking medicine that helps you poop (stool softener) as needed, and if your doctor approves. Eat more fiber by eating fresh fruit, vegetables, and whole grains. Drink enough fluids to keep your pee (urine) clear or pale yellow.  Take warm water baths (sitz baths) to soothe pain or discomfort caused by hemorrhoids. Use hemorrhoid cream if your doctor approves.  If you have puffy, bulging veins (varicose veins), wear support hose. Raise (elevate) your feet for 15 minutes, 3-4 times a day. Limit salt in your diet.  Avoid heavy lifting, wear low heels, and sit up straight.  Rest with your legs raised if you have leg cramps or low back pain.  Visit your dentist if you have not gone during your pregnancy. Use a soft toothbrush to brush your teeth. Be gentle when you floss.  You can have sex (intercourse) unless your doctor tells you not to.  Do not travel far distances unless you must. Only do so with your doctor's approval.  Take prenatal classes.  Practice driving to the hospital.  Pack your hospital bag.  Prepare the baby's room.  Go to your doctor visits. Get help if:  You are not sure if you are in labor or if your water has broken.  You are dizzy.  You have mild cramps or pressure in  your lower belly (abdominal).  You have a nagging pain in your belly area.  You continue to feel sick to your stomach (nauseous), throw up (vomit), or have watery poop (diarrhea).  You have bad smelling fluid coming from your vagina.  You have pain with peeing (urination). Get help right away if:  You have a fever.  You are leaking fluid from your vagina.  You are spotting or bleeding from your vagina.  You have  severe belly cramping or pain.  You lose or gain weight rapidly.  You have trouble catching your breath and have chest pain.  You notice sudden or extreme puffiness (swelling) of your face, hands, ankles, feet, or legs.  You have not felt the baby move in over an hour.  You have severe headaches that do not go away with medicine.  You have vision changes. This information is not intended to replace advice given to you by your health care provider. Make sure you discuss any questions you have with your health care provider. Document Released: 11/01/2009 Document Revised: 01/13/2016 Document Reviewed: 10/08/2012 Elsevier Interactive Patient Education  2017 [employer].

## 2018-03-01 NOTE — Addendum Note (Signed)
Addended by: Natale MilchSTALLING, Addisson Frate D on: 03/01/2018 09:20 AM   Modules accepted: Orders

## 2018-03-02 LAB — CBC
Hematocrit: 32 % — ABNORMAL LOW (ref 34.0–46.6)
Hemoglobin: 10.5 g/dL — ABNORMAL LOW (ref 11.1–15.9)
MCH: 31.3 pg (ref 26.6–33.0)
MCHC: 32.8 g/dL (ref 31.5–35.7)
MCV: 95 fL (ref 79–97)
Platelets: 219 10*3/uL (ref 150–450)
RBC: 3.36 x10E6/uL — ABNORMAL LOW (ref 3.77–5.28)
RDW: 13.9 % (ref 12.3–15.4)
WBC: 6.6 10*3/uL (ref 3.4–10.8)

## 2018-03-02 LAB — GLUCOSE TOLERANCE, 2 HOURS W/ 1HR
Glucose, 1 hour: 99 mg/dL (ref 65–179)
Glucose, 2 hour: 81 mg/dL (ref 65–152)
Glucose, Fasting: 71 mg/dL (ref 65–91)

## 2018-03-02 LAB — RPR: RPR Ser Ql: NONREACTIVE

## 2018-03-02 LAB — HIV ANTIBODY (ROUTINE TESTING W REFLEX): HIV Screen 4th Generation wRfx: NONREACTIVE

## 2018-03-15 ENCOUNTER — Ambulatory Visit (INDEPENDENT_AMBULATORY_CARE_PROVIDER_SITE_OTHER): Payer: Managed Care, Other (non HMO) | Admitting: Nurse Practitioner

## 2018-03-15 ENCOUNTER — Other Ambulatory Visit: Payer: Self-pay

## 2018-03-15 VITALS — BP 113/69 | HR 90 | Wt 200.0 lb

## 2018-03-15 DIAGNOSIS — B3731 Acute candidiasis of vulva and vagina: Secondary | ICD-10-CM

## 2018-03-15 DIAGNOSIS — Z3400 Encounter for supervision of normal first pregnancy, unspecified trimester: Secondary | ICD-10-CM

## 2018-03-15 DIAGNOSIS — Z3403 Encounter for supervision of normal first pregnancy, third trimester: Secondary | ICD-10-CM

## 2018-03-15 DIAGNOSIS — B373 Candidiasis of vulva and vagina: Secondary | ICD-10-CM

## 2018-03-15 MED ORDER — COMFORT FIT MATERNITY SUPP LG MISC: 0 refills | Status: DC

## 2018-03-15 MED ORDER — TERCONAZOLE 0.4 % VA CREA: 1.0000 | TOPICAL_CREAM | Freq: Every day | VAGINAL | 0 refills | Status: DC

## 2018-03-15 NOTE — Patient Instructions (Signed)

## 2018-03-15 NOTE — Progress Notes (Signed)
    Subjective:  Margaret Goodman is a 22 y.o. G1P0 at 5953w4d being seen today for ongoing prenatal care.  She is currently monitored for the following issues for this low-risk pregnancy and has Supervision of normal first pregnancy, antepartum; Vitamin D deficiency; and Rubella non-immune status, antepartum on their problem list.  Patient reports irritation of gential area..  Has been leaving off underwear at night.  Used A&D ointment in the groin area - that seems to be where it started.  Burns after bathing. Contractions: Not present. Vag. Bleeding: None.  Movement: Present. Denies leaking of fluid.   The following portions of the patient's history were reviewed and updated as appropriate: allergies, current medications, past family history, past medical history, past social history, past surgical history and problem list. Problem list updated.  Objective:   Vitals:   03/15/18 0819  BP: 113/69  Pulse: 90  Weight: 200 lb (90.7 kg)    Fetal Status: Fetal Heart Rate (bpm): 148 Fundal Height: 31 cm Movement: Present     General:  Alert, oriented and cooperative. Patient is in no acute distress.  Skin: Skin is warm and dry. No rash noted.   Cardiovascular: Normal heart rate noted  Respiratory: Normal respiratory effort, no problems with respiration noted  Abdomen: Soft, gravid, appropriate for gestational age. Pain/Pressure: Present     Pelvic:  Cervical exam deferred      Visual inspection of genital area shows hypopigmentation of labia bilaterally and clitoris.  Does not extend to groin.  Extremities: Normal range of motion.  Edema: None  Mental Status: Normal mood and affect. Normal behavior. Normal judgment and thought content.     Assessment and Plan:  Pregnancy: G1P0 at 8053w4d  1. Supervision of normal first pregnancy, antepartum Has not yet signed up for childbirth classes but is planning to register.  Is taking Prilosec and it is helping.  Did not get belly support band and has  lost the prescription - will reprint.  Hip pain has improved.  Baby is moving lots.  2. Yeast infection involving the vagina and surrounding area Terazol cream prescribed with instructions to use internally but especially externally at least daily and more if it helps the area feel better.  Preterm labor symptoms and general obstetric precautions including but not limited to vaginal bleeding, contractions, leaking of fluid and fetal movement were reviewed in detail with the patient. Please refer to After Visit Summary for other counseling recommendations.  Return in about 2 weeks (around 03/29/2018).  Nolene BernheimERRI Keyleigh Manninen, RN, MSN, NP-BC Nurse Practitioner, [facility], [facility] 03/15/2018 8:37 AM

## 2018-03-15 NOTE — Progress Notes (Signed)
ROB. C/o sweating, burning, itching and irritation in groin x 1 1/2 week.
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
## 2018-03-29 ENCOUNTER — Ambulatory Visit (INDEPENDENT_AMBULATORY_CARE_PROVIDER_SITE_OTHER): Payer: Managed Care, Other (non HMO) | Admitting: Advanced Practice Midwife

## 2018-03-29 DIAGNOSIS — Z3400 Encounter for supervision of normal first pregnancy, unspecified trimester: Secondary | ICD-10-CM

## 2018-03-29 NOTE — Patient Instructions (Signed)
Third Trimester of Pregnancy The third trimester is from week 28 through week 40 (months 7 through 9). The third trimester is a time when the unborn baby (fetus) is growing rapidly. At the end of the ninth month, the fetus is about 20 inches in length and weighs 6-10 pounds. Body changes during your third trimester Your body will continue to go through many changes during pregnancy. The changes vary from woman to woman. During the third trimester:  Your weight will continue to increase. You can expect to gain 25-35 pounds (11-16 kg) by the end of the pregnancy.  You may begin to get stretch marks on your hips, abdomen, and breasts.  You may urinate more often because the fetus is moving lower into your pelvis and pressing on your bladder.  You may develop or continue to have heartburn. This is caused by increased hormones that slow down muscles in the digestive tract.  You may develop or continue to have constipation because increased hormones slow digestion and cause the muscles that push waste through your intestines to relax.  You may develop hemorrhoids. These are swollen veins (varicose veins) in the rectum that can itch or be painful.  You may develop swollen, bulging veins (varicose veins) in your legs.  You may have increased body aches in the pelvis, back, or thighs. This is due to weight gain and increased hormones that are relaxing your joints.  You may have changes in your hair. These can include thickening of your hair, rapid growth, and changes in texture. Some women also have hair loss during or after pregnancy, or hair that feels dry or thin. Your hair will most likely return to normal after your baby is born.  Your breasts will continue to grow and they will continue to become tender. A yellow fluid (colostrum) may leak from your breasts. This is the first milk you are producing for your baby.  Your belly button may stick out.  You may notice more swelling in your hands,  face, or ankles.  You may have increased tingling or numbness in your hands, arms, and legs. The skin on your belly may also feel numb.  You may feel short of breath because of your expanding uterus.  You may have more problems sleeping. This can be caused by the size of your belly, increased need to urinate, and an increase in your body's metabolism.  You may notice the fetus "dropping," or moving lower in your abdomen (lightening).  You may have increased vaginal discharge.  You may notice your joints feel loose and you may have pain around your pelvic bone.  What to expect at prenatal visits You will have prenatal exams every 2 weeks until week 36. Then you will have weekly prenatal exams. During a routine prenatal visit:  You will be weighed to make sure you and the baby are growing normally.  Your blood pressure will be taken.  Your abdomen will be measured to track your baby's growth.  The fetal heartbeat will be listened to.  Any test results from the previous visit will be discussed.  You may have a cervical check near your due date to see if your cervix has softened or thinned (effaced).  You will be tested for Group B streptococcus. This happens between 35 and 37 weeks.  Your health care provider may ask you:  What your birth plan is.  How you are feeling.  If you are feeling the baby move.  If you have had   any abnormal symptoms, such as leaking fluid, bleeding, severe headaches, or abdominal cramping.  If you are using any tobacco products, including cigarettes, chewing tobacco, and electronic cigarettes.  If you have any questions.  Other tests or screenings that may be performed during your third trimester include:  Blood tests that check for low iron levels (anemia).  Fetal testing to check the health, activity level, and growth of the fetus. Testing is done if you have certain medical conditions or if there are problems during the  pregnancy.  Nonstress test (NST). This test checks the health of your baby to make sure there are no signs of problems, such as the baby not getting enough oxygen. During this test, a belt is placed around your belly. The baby is made to move, and its heart rate is monitored during movement.  What is false labor? False labor is a condition in which you feel small, irregular tightenings of the muscles in the womb (contractions) that usually go away with rest, changing position, or drinking water. These are called Braxton Hicks contractions. Contractions may last for hours, days, or even weeks before true labor sets in. If contractions come at regular intervals, become more frequent, increase in intensity, or become painful, you should see your health care provider. What are the signs of labor?  Abdominal cramps.  Regular contractions that start at 10 minutes apart and become stronger and more frequent with time.  Contractions that start on the top of the uterus and spread down to the lower abdomen and back.  Increased pelvic pressure and dull back pain.  A watery or bloody mucus discharge that comes from the vagina.  Leaking of amniotic fluid. This is also known as your "water breaking." It could be a slow trickle or a gush. Let your health care provider know if it has a color or strange odor. If you have any of these signs, call your health care provider right away, even if it is before your due date. Follow these instructions at home: Medicines  Follow your health care provider's instructions regarding medicine use. Specific medicines may be either safe or unsafe to take during pregnancy.  Take a prenatal vitamin that contains at least 600 micrograms (mcg) of folic acid.  If you develop constipation, try taking a stool softener if your health care provider approves. Eating and drinking  Eat a balanced diet that includes fresh fruits and vegetables, whole grains, good sources of protein  such as meat, eggs, or tofu, and low-fat dairy. Your health care provider will help you determine the amount of weight gain that is right for you.  Avoid raw meat and uncooked cheese. These carry germs that can cause birth defects in the baby.  If you have low calcium intake from food, talk to your health care provider about whether you should take a daily calcium supplement.  Eat four or five small meals rather than three large meals a day.  Limit foods that are high in fat and processed sugars, such as fried and sweet foods.  To prevent constipation: ? Drink enough fluid to keep your urine clear or pale yellow. ? Eat foods that are high in fiber, such as fresh fruits and vegetables, whole grains, and beans. Activity  Exercise only as directed by your health care provider. Most women can continue their usual exercise routine during pregnancy. Try to exercise for 30 minutes at least 5 days a week. Stop exercising if you experience uterine contractions.  Avoid heavy   lifting.  Do not exercise in extreme heat or humidity, or at high altitudes.  Wear low-heel, comfortable shoes.  Practice good posture.  You may continue to have sex unless your health care provider tells you otherwise. Relieving pain and discomfort  Take frequent breaks and rest with your legs elevated if you have leg cramps or low back pain.  Take warm sitz baths to soothe any pain or discomfort caused by hemorrhoids. Use hemorrhoid cream if your health care provider approves.  Wear a good support bra to prevent discomfort from breast tenderness.  If you develop varicose veins: ? Wear support pantyhose or compression stockings as told by your healthcare provider. ? Elevate your feet for 15 minutes, 3-4 times a day. Prenatal care  Write down your questions. Take them to your prenatal visits.  Keep all your prenatal visits as told by your health care provider. This is important. Safety  Wear your seat belt at  all times when driving.  Make a list of emergency phone numbers, including numbers for family, friends, the hospital, and police and fire departments. General instructions  Avoid cat litter boxes and soil used by cats. These carry germs that can cause birth defects in the baby. If you have a cat, ask someone to clean the litter box for you.  Do not travel far distances unless it is absolutely necessary and only with the approval of your health care provider.  Do not use hot tubs, steam rooms, or saunas.  Do not drink alcohol.  Do not use any products that contain nicotine or tobacco, such as cigarettes and e-cigarettes. If you need help quitting, ask your health care provider.  Do not use any medicinal herbs or unprescribed drugs. These chemicals affect the formation and growth of the baby.  Do not douche or use tampons or scented sanitary pads.  Do not cross your legs for long periods of time.  To prepare for the arrival of your baby: ? Take prenatal classes to understand, practice, and ask questions about labor and delivery. ? Make a trial run to the hospital. ? Visit the hospital and tour the maternity area. ? Arrange for maternity or paternity leave through employers. ? Arrange for family and friends to take care of pets while you are in the hospital. ? Purchase a rear-facing car seat and make sure you know how to install it in your car. ? Pack your hospital bag. ? Prepare the baby's nursery. Make sure to remove all pillows and stuffed animals from the baby's crib to prevent suffocation.  Visit your dentist if you have not gone during your pregnancy. Use a soft toothbrush to brush your teeth and be gentle when you floss. Contact a health care provider if:  You are unsure if you are in labor or if your water has broken.  You become dizzy.  You have mild pelvic cramps, pelvic pressure, or nagging pain in your abdominal area.  You have lower back pain.  You have persistent  nausea, vomiting, or diarrhea.  You have an unusual or bad smelling vaginal discharge.  You have pain when you urinate. Get help right away if:  Your water breaks before 37 weeks.  You have regular contractions less than 5 minutes apart before 37 weeks.  You have a fever.  You are leaking fluid from your vagina.  You have spotting or bleeding from your vagina.  You have severe abdominal pain or cramping.  You have rapid weight loss or weight gain.    You have shortness of breath with chest pain.  You notice sudden or extreme swelling of your face, hands, ankles, feet, or legs.  Your baby makes fewer than 10 movements in 2 hours.  You have severe headaches that do not go away when you take medicine.  You have vision changes. Summary  The third trimester is from week 28 through week 40, months 7 through 9. The third trimester is a time when the unborn baby (fetus) is growing rapidly.  During the third trimester, your discomfort may increase as you and your baby continue to gain weight. You may have abdominal, leg, and back pain, sleeping problems, and an increased need to urinate.  During the third trimester your breasts will keep growing and they will continue to become tender. A yellow fluid (colostrum) may leak from your breasts. This is the first milk you are producing for your baby.  False labor is a condition in which you feel small, irregular tightenings of the muscles in the womb (contractions) that eventually go away. These are called Braxton Hicks contractions. Contractions may last for hours, days, or even weeks before true labor sets in.  Signs of labor can include: abdominal cramps; regular contractions that start at 10 minutes apart and become stronger and more frequent with time; watery or bloody mucus discharge that comes from the vagina; increased pelvic pressure and dull back pain; and leaking of amniotic fluid. This information is not intended to replace advice  given to you by your health care provider. Make sure you discuss any questions you have with your health care provider. Document Released: 08/01/2001 Document Revised: 01/13/2016 Document Reviewed: 10/08/2012 Elsevier Interactive Patient Education  2017 [employer]  

## 2018-03-29 NOTE — Progress Notes (Signed)
Pt is G1P0 1066w4d here for ROB. No concerns today.

## 2018-03-29 NOTE — Progress Notes (Signed)
   PRENATAL VISIT NOTE  Subjective:  Margaret Goodman is a 22 y.o. G1P0 at 378w4d being seen today for ongoing prenatal care.  She is currently monitored for the following issues for this low-risk pregnancy and has Supervision of normal first pregnancy, antepartum; Vitamin D deficiency; and Rubella non-immune status, antepartum on their problem list.  Patient reports no complaints.  Contractions: Not present. Vag. Bleeding: None.  Movement: Present. Denies leaking of fluid.   The following portions of the patient's history were reviewed and updated as appropriate: allergies, current medications, past family history, past medical history, past social history, past surgical history and problem list. Problem list updated.  Objective:   Vitals:   03/29/18 0811  BP: 116/70  Pulse: 79  Weight: 90.8 kg    Fetal Status: Fetal Heart Rate (bpm): 145   Movement: Present     General:  Alert, oriented and cooperative. Patient is in no acute distress.  Skin: Skin is warm and dry. No rash noted.   Cardiovascular: Normal heart rate noted  Respiratory: Normal respiratory effort, no problems with respiration noted  Abdomen: Soft, gravid, appropriate for gestational age.  Pain/Pressure: Present     Pelvic: Cervical exam deferred        Extremities: Normal range of motion.  Edema: None  Mental Status: Normal mood and affect. Normal behavior. Normal judgment and thought content.   Assessment and Plan:  Pregnancy: G1P0 at 808w4d  1. Supervision of normal first pregnancy, antepartum --Anticipatory guidance about next visits/weeks of pregnancy given.  Preterm labor symptoms and general obstetric precautions including but not limited to vaginal bleeding, contractions, leaking of fluid and fetal movement were reviewed in detail with the patient. Please refer to After Visit Summary for other counseling recommendations.  Return in about 2 weeks (around 04/12/2018).  Future Appointments  Date Time Provider  Department Center  04/12/2018  9:50 AM Kooistra, Charlesetta GaribaldiKathryn Lorraine, CNM [facility] None    Sharen CounterLisa Leftwich-Kirby, CNM

## 2018-04-12 ENCOUNTER — Ambulatory Visit (INDEPENDENT_AMBULATORY_CARE_PROVIDER_SITE_OTHER): Payer: Self-pay | Admitting: Pediatrics

## 2018-04-12 ENCOUNTER — Ambulatory Visit (INDEPENDENT_AMBULATORY_CARE_PROVIDER_SITE_OTHER): Payer: Managed Care, Other (non HMO) | Admitting: Student

## 2018-04-12 DIAGNOSIS — Z3400 Encounter for supervision of normal first pregnancy, unspecified trimester: Secondary | ICD-10-CM

## 2018-04-12 DIAGNOSIS — Z7681 Expectant parent(s) prebirth pediatrician visit: Secondary | ICD-10-CM

## 2018-04-12 DIAGNOSIS — Z3481 Encounter for supervision of other normal pregnancy, first trimester: Secondary | ICD-10-CM

## 2018-04-12 NOTE — Patient Instructions (Signed)

## 2018-04-12 NOTE — Progress Notes (Signed)
Prenatal counseling for impending newborn done--1st child, currently 33wks, no complications, prenatal started 12wks Z76.81

## 2018-04-12 NOTE — Progress Notes (Signed)
   PRENATAL VISIT NOTE  Subjective:  Margaret Goodman is a 22 y.o. G1P0 at 5179w4d being seen today for ongoing prenatal care.  She is currently monitored for the following issues for this low-risk pregnancy and has Supervision of normal first pregnancy, antepartum; Vitamin D deficiency; and Rubella non-immune status, antepartum on their problem list.  Patient reports no complaints other than dizziness when she gets overheated. States she is only drinking 2-3 glasses of water a day but she is drinking powerade and tea during the day.  Contractions: Irritability. Vag. Bleeding: None.  Movement: Present. Denies leaking of fluid.   The following portions of the patient's history were reviewed and updated as appropriate: allergies, current medications, past family history, past medical history, past social history, past surgical history and problem list. Problem list updated.  Objective:   Vitals:   04/12/18 1002  BP: 119/78  Pulse: 96  Weight: 205 lb 4.8 oz (93.1 kg)    Fetal Status: Fetal Heart Rate (bpm): 145 Fundal Height: 33 cm Movement: Present     General:  Alert, oriented and cooperative. Patient is in no acute distress.  Skin: Skin is warm and dry. No rash noted.   Cardiovascular: Normal heart rate noted  Respiratory: Normal respiratory effort, no problems with respiration noted  Abdomen: Soft, gravid, appropriate for gestational age.  Pain/Pressure: Present     Pelvic: Cervical exam deferred        Extremities: Normal range of motion.  Edema: None  Mental Status: Normal mood and affect. Normal behavior. Normal judgment and thought content.   Assessment and Plan:  Pregnancy: G1P0 at 8079w4d  1. Supervision of normal first pregnancy, antepartum -Discussed drinking more water, rest, eating frequent snacks.   Preterm labor symptoms and general obstetric precautions including but not limited to vaginal bleeding, contractions, leaking of fluid and fetal movement were reviewed in detail  with the patient. Please refer to After Visit Summary for other counseling recommendations.  Return in about 2 weeks (around 04/26/2018), or LROB.  Future Appointments  Date Time Provider Department Center  04/12/2018 12:15 PM Myles GipAgbuya, Perry Scott, DO [facility]    Charlesetta GaribaldiKathryn Lorraine [place], [place]

## 2018-04-16 DIAGNOSIS — Z3481 Encounter for supervision of other normal pregnancy, first trimester: Secondary | ICD-10-CM

## 2018-04-24 ENCOUNTER — Ambulatory Visit (INDEPENDENT_AMBULATORY_CARE_PROVIDER_SITE_OTHER): Payer: Managed Care, Other (non HMO) | Admitting: Family Medicine

## 2018-04-24 DIAGNOSIS — Z3400 Encounter for supervision of normal first pregnancy, unspecified trimester: Secondary | ICD-10-CM

## 2018-04-24 NOTE — Patient Instructions (Signed)

## 2018-04-24 NOTE — Progress Notes (Signed)
    PRENATAL VISIT NOTE  Subjective:  Margaret Goodman is a 22 y.o. G1P0 at [redacted]w[redacted]d being seen today for ongoing prenatal care.  She is currently monitored for the following issues for this low-risk pregnancy and has Supervision of normal first pregnancy, antepartum; Vitamin D deficiency; and Rubella non-immune status, antepartum on their problem list.  Patient reports no complaints.  Contractions: Irritability. Vag. Bleeding: None.  Movement: Present. Denies leaking of fluid.   The following portions of the patient's history were reviewed and updated as appropriate: allergies, current medications, past family history, past medical history, past social history, past surgical history and problem list. Problem list updated.  Objective:   Vitals:   04/24/18 1553  BP: 126/76  Pulse: (!) 102  Weight: 211 lb 3.2 oz (95.8 kg)    Fetal Status: Fetal Heart Rate (bpm): 145 Fundal Height: 35 cm Movement: Present     General:  Alert, oriented and cooperative. Patient is in no acute distress.  Skin: Skin is warm and dry. No rash noted.   Cardiovascular: Normal heart rate noted  Respiratory: Normal respiratory effort, no problems with respiration noted  Abdomen: Soft, gravid, appropriate for gestational age.  Pain/Pressure: Present     Pelvic: Cervical exam deferred        Extremities: Normal range of motion.  Edema: None  Mental Status: Normal mood and affect. Normal behavior. Normal judgment and thought content.   Assessment and Plan:  Pregnancy: G1P0 at [redacted]w[redacted]d  1. Supervision of normal first pregnancy, antepartum Continue routine prenatal care. Declined flu  Preterm labor symptoms and general obstetric precautions including but not limited to vaginal bleeding, contractions, leaking of fluid and fetal movement were reviewed in detail with the patient. Please refer to After Visit Summary for other counseling recommendations.  Return in 1 week (on 05/01/2018).  Future Appointments  Date Time  Provider Department Center  05/03/2018 10:00 AM Brock Bad, MD [facility] None  05/10/2018  8:10 AM Calvert Cantor, CNM [facility] None    Reva Bores, MD

## 2018-04-24 NOTE — Progress Notes (Signed)
ROB.  Declined FLU.

## 2018-05-02 ENCOUNTER — Encounter: Payer: Managed Care, Other (non HMO) | Admitting: Obstetrics

## 2018-05-03 ENCOUNTER — Encounter: Payer: Self-pay | Admitting: Obstetrics

## 2018-05-03 ENCOUNTER — Ambulatory Visit (INDEPENDENT_AMBULATORY_CARE_PROVIDER_SITE_OTHER): Payer: Managed Care, Other (non HMO) | Admitting: Obstetrics

## 2018-05-03 ENCOUNTER — Other Ambulatory Visit (HOSPITAL_COMMUNITY)
Admission: RE | Admit: 2018-05-03 | Discharge: 2018-05-03 | Disposition: A | Discharge status: Home / Self Care | Payer: Managed Care, Other (non HMO) | Source: Ambulatory Visit | Attending: Obstetrics | Admitting: Obstetrics

## 2018-05-03 VITALS — BP 94/43 | HR 90 | Wt 207.0 lb

## 2018-05-03 DIAGNOSIS — N898 Other specified noninflammatory disorders of vagina: Secondary | ICD-10-CM | POA: Diagnosis not present

## 2018-05-03 DIAGNOSIS — Z3403 Encounter for supervision of normal first pregnancy, third trimester: Secondary | ICD-10-CM

## 2018-05-03 DIAGNOSIS — Z3400 Encounter for supervision of normal first pregnancy, unspecified trimester: Secondary | ICD-10-CM

## 2018-05-03 NOTE — Progress Notes (Signed)
Pt presents for ROB without c/o today. GBS/GC/CT due today.

## 2018-05-03 NOTE — Progress Notes (Signed)
Subjective:  Margaret OverlyJanique Goodman is a 22 y.o. G1P0 at 10064w4d being seen today for ongoing prenatal care.  She is currently monitored for the following issues for this low-risk pregnancy and has Supervision of normal first pregnancy, antepartum; Vitamin D deficiency; and Rubella non-immune status, antepartum on their problem list.  Patient reports no complaints.  Contractions: Irregular. Vag. Bleeding: None.  Movement: Present. Denies leaking of fluid.   The following portions of the patient's history were reviewed and updated as appropriate: allergies, current medications, past family history, past medical history, past social history, past surgical history and problem list. Problem list updated.  Objective:   Vitals:   05/03/18 1034  BP: (!) 94/43  Pulse: 90  Weight: 207 lb (93.9 kg)    Fetal Status: Fetal Heart Rate (bpm): 140   Movement: Present     General:  Alert, oriented and cooperative. Patient is in no acute distress.  Skin: Skin is warm and dry. No rash noted.   Cardiovascular: Normal heart rate noted  Respiratory: Normal respiratory effort, no problems with respiration noted  Abdomen: Soft, gravid, appropriate for gestational age. Pain/Pressure: Present     Pelvic:  Cervical exam deferred        Extremities: Normal range of motion.  Edema: Trace  Mental Status: Normal mood and affect. Normal behavior. Normal judgment and thought content.   Urinalysis:     X: Assessment and Plan:  Pregnancy: G1P0 at 2764w4d  1. Supervision of normal first pregnancy, antepartum Rx: - Culture, beta strep (group b only)  2. Vaginal discharge Rx: - Cervicovaginal ancillary only  Preterm labor symptoms and general obstetric precautions including but not limited to vaginal bleeding, contractions, leaking of fluid and fetal movement were reviewed in detail with the patient. Please refer to After Visit Summary for other counseling recommendations.  Return in about 1 week (around 05/10/2018) for  ROB.   Brock BadHarper, Jahmani Staup A, MD

## 2018-05-06 ENCOUNTER — Other Ambulatory Visit: Payer: Self-pay | Admitting: Obstetrics

## 2018-05-06 DIAGNOSIS — B3731 Acute candidiasis of vulva and vagina: Secondary | ICD-10-CM

## 2018-05-06 DIAGNOSIS — B9689 Other specified bacterial agents as the cause of diseases classified elsewhere: Secondary | ICD-10-CM

## 2018-05-06 DIAGNOSIS — N760 Acute vaginitis: Secondary | ICD-10-CM

## 2018-05-06 DIAGNOSIS — B373 Candidiasis of vulva and vagina: Secondary | ICD-10-CM

## 2018-05-06 LAB — CERVICOVAGINAL ANCILLARY ONLY
Bacterial vaginitis: POSITIVE — AB
Candida vaginitis: POSITIVE — AB
Chlamydia: NEGATIVE
Neisseria Gonorrhea: NEGATIVE
Trichomonas: NEGATIVE

## 2018-05-06 MED ORDER — TERCONAZOLE 0.8 % VA CREA
1.0000 | TOPICAL_CREAM | Freq: Every day | VAGINAL | 0 refills | Status: DC
Start: 2018-05-06 — End: 2018-05-17

## 2018-05-06 MED ORDER — TINIDAZOLE 500 MG PO TABS: 1000.0000 mg | ORAL_TABLET | Freq: Every day | ORAL | 2 refills | Status: DC

## 2018-05-07 LAB — CULTURE, BETA STREP (GROUP B ONLY): Strep Gp B Culture: NEGATIVE

## 2018-05-10 ENCOUNTER — Ambulatory Visit (INDEPENDENT_AMBULATORY_CARE_PROVIDER_SITE_OTHER): Payer: Managed Care, Other (non HMO) | Admitting: Advanced Practice Midwife

## 2018-05-10 VITALS — BP 109/71 | HR 89 | Wt 213.4 lb

## 2018-05-10 DIAGNOSIS — Z3400 Encounter for supervision of normal first pregnancy, unspecified trimester: Secondary | ICD-10-CM

## 2018-05-10 DIAGNOSIS — O09899 Supervision of other high risk pregnancies, unspecified trimester: Secondary | ICD-10-CM

## 2018-05-10 DIAGNOSIS — O9989 Other specified diseases and conditions complicating pregnancy, childbirth and the puerperium: Secondary | ICD-10-CM

## 2018-05-10 DIAGNOSIS — Z2839 Other underimmunization status: Secondary | ICD-10-CM

## 2018-05-10 DIAGNOSIS — Z283 Underimmunization status: Secondary | ICD-10-CM

## 2018-05-10 DIAGNOSIS — Z3403 Encounter for supervision of normal first pregnancy, third trimester: Secondary | ICD-10-CM

## 2018-05-10 NOTE — Patient Instructions (Signed)
Vaginal Delivery Vaginal delivery means that you will give birth by pushing your baby out of your birth canal (vagina). A team of health care providers will help you before, during, and after vaginal delivery. Birth experiences are unique for every woman and every pregnancy, and birth experiences vary depending on where you choose to give birth. What should I do to prepare for my baby's birth? Before your baby is born, it is important to talk with your health care provider about:  Your labor and delivery preferences. These may include: ? Medicines that you may be given. ? How you will manage your pain. This might include non-medical pain relief techniques or injectable pain relief such as epidural analgesia. ? How you and your baby will be monitored during labor and delivery. ? Who may be in the labor and delivery room with you. ? Your feelings about surgical delivery of your baby (cesarean delivery, or C-section) if this becomes necessary. ? Your feelings about receiving donated blood through an IV tube (blood transfusion) if this becomes necessary.  Whether you are able: ? To take pictures or videos of the birth. ? To eat during labor and delivery. ? To move around, walk, or change positions during labor and delivery.  What to expect after your baby is born, such as: ? Whether delayed umbilical cord clamping and cutting is offered. ? Who will care for your baby right after birth. ? Medicines or tests that may be recommended for your baby. ? Whether breastfeeding is supported in your hospital or birth center. ? How long you will be in the hospital or birth center.  How any medical conditions you have may affect your baby or your labor and delivery experience.  To prepare for your baby's birth, you should also:  Attend all of your health care visits before delivery (prenatal visits) as recommended by your health care provider. This is important.  Prepare your home for your baby's  arrival. Make sure that you have: ? Diapers. ? Baby clothing. ? Feeding equipment. ? Safe sleeping arrangements for you and your baby.  Install a car seat in your vehicle. Have your car seat checked by a certified car seat installer to make sure that it is installed safely.  Think about who will help you with your new baby at home for at least the first several weeks after delivery.  What can I expect when I arrive at the birth center or hospital? Once you are in labor and have been admitted into the hospital or birth center, your health care provider may:  Review your pregnancy history and any concerns you have.  Insert an IV tube into one of your veins. This is used to give you fluids and medicines.  Check your blood pressure, pulse, temperature, and heart rate (vital signs).  Check whether your bag of water (amniotic sac) has broken (ruptured).  Talk with you about your birth plan and discuss pain control options.  Monitoring Your health care provider may monitor your contractions (uterine monitoring) and your baby's heart rate (fetal monitoring). You may need to be monitored:  Often, but not continuously (intermittently).  All the time or for long periods at a time (continuously). Continuous monitoring may be needed if: ? You are taking certain medicines, such as medicine to relieve pain or make your contractions stronger. ? You have pregnancy or labor complications.  Monitoring may be done by:  Placing a special stethoscope or a handheld monitoring device on your abdomen to   check your baby's heartbeat, and feeling your abdomen for contractions. This method of monitoring does not continuously record your baby's heartbeat or your contractions.  Placing monitors on your abdomen (external monitors) to record your baby's heartbeat and the frequency and length of contractions. You may not have to wear external monitors all the time.  Placing monitors inside of your uterus  (internal monitors) to record your baby's heartbeat and the frequency, length, and strength of your contractions. ? Your health care provider may use internal monitors if he or she needs more information about the strength of your contractions or your baby's heart rate. ? Internal monitors are put in place by passing a thin, flexible wire through your vagina and into your uterus. Depending on the type of monitor, it may remain in your uterus or on your baby's head until birth. ? Your health care provider will discuss the benefits and risks of internal monitoring with you and will ask for your permission before inserting the monitors.  Telemetry. This is a type of continuous monitoring that can be done with external or internal monitors. Instead of having to stay in bed, you are able to move around during telemetry. Ask your health care provider if telemetry is an option for you.  Physical exam Your health care provider may perform a physical exam. This may include:  Checking whether your baby is positioned: ? With the head toward your vagina (head-down). This is most common. ? With the head toward the top of your uterus (head-up or breech). If your baby is in a breech position, your health care provider may try to turn your baby to a head-down position so you can deliver vaginally. If it does not seem that your baby can be born vaginally, your provider may recommend surgery to deliver your baby. In rare cases, you may be able to deliver vaginally if your baby is head-up (breech delivery). ? Lying sideways (transverse). Babies that are lying sideways cannot be delivered vaginally.  Checking your cervix to determine: ? Whether it is thinning out (effacing). ? Whether it is opening up (dilating). ? How low your baby has moved into your birth canal.  What are the three stages of labor and delivery?  Normal labor and delivery is divided into the following three stages: Stage 1  Stage 1 is the  longest stage of labor, and it can last for hours or days. Stage 1 includes: ? Early labor. This is when contractions may be irregular, or regular and mild. Generally, early labor contractions are more than 10 minutes apart. ? Active labor. This is when contractions get longer, more regular, more frequent, and more intense. ? The transition phase. This is when contractions happen very close together, are very intense, and may last longer than during any other part of labor.  Contractions generally feel mild, infrequent, and irregular at first. They get stronger, more frequent (about every 2-3 minutes), and more regular as you progress from early labor through active labor and transition.  Many women progress through stage 1 naturally, but you may need help to continue making progress. If this happens, your health care provider may talk with you about: ? Rupturing your amniotic sac if it has not ruptured yet. ? Giving you medicine to help make your contractions stronger and more frequent.  Stage 1 ends when your cervix is completely dilated to 4 inches (10 cm) and completely effaced. This happens at the end of the transition phase. Stage 2  Once   your cervix is completely effaced and dilated to 4 inches (10 cm), you may start to feel an urge to push. It is common for the body to naturally take a rest before feeling the urge to push, especially if you received an epidural or certain other pain medicines. This rest period may last for up to 1-2 hours, depending on your unique labor experience.  During stage 2, contractions are generally less painful, because pushing helps relieve contraction pain. Instead of contraction pain, you may feel stretching and burning pain, especially when the widest part of your baby's head passes through the vaginal opening (crowning).  Your health care provider will closely monitor your pushing progress and your baby's progress through the vagina during stage 2.  Your  health care provider may massage the area of skin between your vaginal opening and anus (perineum) or apply warm compresses to your perineum. This helps it stretch as the baby's head starts to crown, which can help prevent perineal tearing. ? In some cases, an incision may be made in your perineum (episiotomy) to allow the baby to pass through the vaginal opening. An episiotomy helps to make the opening of the vagina larger to allow more room for the baby to fit through.  It is very important to breathe and focus so your health care provider can control the delivery of your baby's head. Your health care provider may have you decrease the intensity of your pushing, to help prevent perineal tearing.  After delivery of your baby's head, the shoulders and the rest of the body generally deliver very quickly and without difficulty.  Once your baby is delivered, the umbilical cord may be cut right away, or this may be delayed for 1-2 minutes, depending on your baby's health. This may vary among health care providers, hospitals, and birth centers.  If you and your baby are healthy enough, your baby may be placed on your chest or abdomen to help maintain the baby's temperature and to help you bond with each other. Some mothers and babies start breastfeeding at this time. Your health care team will dry your baby and help keep your baby warm during this time.  Your baby may need immediate care if he or she: ? Showed signs of distress during labor. ? Has a medical condition. ? Was born too early (prematurely). ? Had a bowel movement before birth (meconium). ? Shows signs of difficulty transitioning from being inside the uterus to being outside of the uterus. If you are planning to breastfeed, your health care team will help you begin a feeding. Stage 3  The third stage of labor starts immediately after the birth of your baby and ends after you deliver the placenta. The placenta is an organ that develops  during pregnancy to provide oxygen and nutrients to your baby in the womb.  Delivering the placenta may require some pushing, and you may have mild contractions. Breastfeeding can stimulate contractions to help you deliver the placenta.  After the placenta is delivered, your uterus should tighten (contract) and become firm. This helps to stop bleeding in your uterus. To help your uterus contract and to control bleeding, your health care provider may: ? Give you medicine by injection, through an IV tube, by mouth, or through your rectum (rectally). ? Massage your abdomen or perform a vaginal exam to remove any blood clots that are left in your uterus. ? Empty your bladder by placing a thin, flexible tube (catheter) into your bladder. ? Encourage   you to breastfeed your baby. After labor is over, you and your baby will be monitored closely to ensure that you are both healthy until you are ready to go home. Your health care team will teach you how to care for yourself and your baby. This information is not intended to replace advice given to you by your health care provider. Make sure you discuss any questions you have with your health care provider. Document Released: 05/16/2008 Document Revised: 02/25/2016 Document Reviewed: 08/22/2015 Elsevier Interactive Patient Education  2018 [employer]  

## 2018-05-10 NOTE — Progress Notes (Addendum)
   PRENATAL VISIT NOTE  Subjective:  Margaret Goodman is a 22 y.o. G1P0 at 3w4dbeing seen today for ongoing prenatal care.  She is currently monitored for the following issues for this low-risk pregnancy and has Supervision of normal first pregnancy, antepartum; Vitamin D deficiency; and Rubella non-immune status, antepartum on their problem list.  Patient reports occasional contractions.  Contractions: Irregular. Vag. Bleeding: None.  Movement: Present. Denies leaking of fluid.   The following portions of the patient's history were reviewed and updated as appropriate: allergies, current medications, past family history, past medical history, past social history, past surgical history and problem list. Problem list updated.  Objective:   Vitals:   05/10/18 0815  BP: 109/71  Pulse: 89  Weight: 213 lb 6.4 oz (96.8 kg)    Fetal Status: Fetal Heart Rate (bpm): 138 Fundal Height: 38 cm Movement: Present  Presentation: Vertex  General:  Alert, oriented and cooperative. Patient is in no acute distress.  Skin: Skin is warm and dry. No rash noted.   Cardiovascular: Normal heart rate noted  Respiratory: Normal respiratory effort, no problems with respiration noted  Abdomen: Soft, gravid, appropriate for gestational age.  Pain/Pressure: Present     Pelvic: Cervical exam performed per pt request  Dilation: 1 Effacement (%): 50 Station: -3  Extremities: Normal range of motion.  Edema: Trace  Mental Status: Normal mood and affect. Normal behavior. Normal judgment and thought content.   Assessment and Plan:  Pregnancy: G1P0 at 339w4d1. Supervision of normal first pregnancy, antepartum --No complaints or concerns, continue routine care --Reviewed GBS NEG --Weight up 6+ lbs from last week, total wt gain 25 lbs, fundal height appropriate for GA --Discussed labor contractions vs [address]-Encouraged patient to review birth plans and customize --Provided directions to MAU in case of  labor  2. Rubella non-immune status, antepartum --MMR postpartum  Term labor symptoms and general obstetric precautions including but not limited to vaginal bleeding, contractions, leaking of fluid and fetal movement were reviewed in detail with the patient. Please refer to After Visit Summary for other counseling recommendations.  Return in about 1 week (around 05/17/2018).  No future appointments.  SaDarlina RumpfCNM  05/10/18  8:30 AM

## 2018-05-10 NOTE — Progress Notes (Signed)
Pt is here for ROB. G1P0 7852w4d

## 2018-05-13 ENCOUNTER — Telehealth: Payer: Self-pay

## 2018-05-13 NOTE — Telephone Encounter (Signed)
TC from pt c/o mucous plug expelling. Pt reports +FM, denies VB and ctx's. Explained to pt this is normal at this point in her pregnancy.  Pt will continue to monitor and if she experiences LOF, VB, DEC FM, ctx's every 3-5 mins, contact the office or report to MAU after hours pt agrees and voices understanding.

## 2018-05-17 ENCOUNTER — Encounter: Payer: Self-pay | Admitting: Nurse Practitioner

## 2018-05-17 ENCOUNTER — Ambulatory Visit (INDEPENDENT_AMBULATORY_CARE_PROVIDER_SITE_OTHER): Payer: Managed Care, Other (non HMO) | Admitting: Nurse Practitioner

## 2018-05-17 DIAGNOSIS — Z3400 Encounter for supervision of normal first pregnancy, unspecified trimester: Secondary | ICD-10-CM

## 2018-05-17 NOTE — Progress Notes (Signed)
ROB request cervix check. ?

## 2018-05-17 NOTE — Progress Notes (Signed)
    Subjective:  Margaret Goodman is a 22 y.o. G1P0 at [redacted]w[redacted]d being seen today for ongoing prenatal care.  She is currently monitored for the following issues for this low-risk pregnancy and has Supervision of normal first pregnancy, antepartum; Vitamin D deficiency; and Rubella non-immune status, antepartum on their problem list.  Patient reports occasional contractions.  Lost mucus plug last week.  No pain with current contractions but notices them.  Contractions: Irregular. Vag. Bleeding: None.  Movement: Present. Denies leaking of fluid.   The following portions of the patient's history were reviewed and updated as appropriate: allergies, current medications, past family history, past medical history, past social history, past surgical history and problem list. Problem list updated.  Objective:   Vitals:   05/17/18 1107  BP: 109/72  Pulse: 70  Weight: 212 lb 3.2 oz (96.3 kg)    Fetal Status: Fetal Heart Rate (bpm): 132 Fundal Height: 40 cm Movement: Present  Presentation: Vertex  General:  Alert, oriented and cooperative. Patient is in no acute distress.  Skin: Skin is warm and dry. No rash noted.   Cardiovascular: Normal heart rate noted  Respiratory: Normal respiratory effort, no problems with respiration noted  Abdomen: Soft, gravid, appropriate for gestational age. Pain/Pressure: Present     Pelvic:  Cervical exam performed Dilation: 1 Effacement (%): 50 Station: -2  Cervix essentially unchanged from last visit.  Cervix is posterior.  Extremities: Normal range of motion.  Edema: Trace  Mental Status: Normal mood and affect. Normal behavior. Normal judgment and thought content.   Urinalysis:      Assessment and Plan:  Pregnancy: G1P0 at [redacted]w[redacted]d  1. Supervision of normal first pregnancy, antepartum Doing well and excited that the time for labor is near.  Term labor symptoms and general obstetric precautions including but not limited to vaginal bleeding, contractions, leaking of  fluid and fetal movement were reviewed in detail with the patient. Please refer to After Visit Summary for other counseling recommendations.  Return in about 1 week (around 05/24/2018).  Nolene Bernheim, RN, MSN, NP-BC Nurse Practitioner, [facility] for [employer], [facility] 05/17/2018 11:48 AM

## 2018-05-18 ENCOUNTER — Inpatient Hospital Stay (HOSPITAL_COMMUNITY): Payer: 59 | Admitting: Anesthesiology

## 2018-05-18 ENCOUNTER — Encounter (HOSPITAL_COMMUNITY): Payer: Self-pay | Admitting: *Deleted

## 2018-05-18 ENCOUNTER — Other Ambulatory Visit: Payer: Self-pay

## 2018-05-18 ENCOUNTER — Inpatient Hospital Stay (HOSPITAL_COMMUNITY)
Admission: AD | Admit: 2018-05-18 | Discharge: 2018-05-20 | DRG: 807 | Disposition: A | Discharge status: Home / Self Care | Payer: 59 | Attending: Family Medicine | Admitting: Family Medicine

## 2018-05-18 DIAGNOSIS — Z3A38 38 weeks gestation of pregnancy: Secondary | ICD-10-CM

## 2018-05-18 DIAGNOSIS — Z3483 Encounter for supervision of other normal pregnancy, third trimester: Secondary | ICD-10-CM | POA: Diagnosis present

## 2018-05-18 DIAGNOSIS — O288 Other abnormal findings on antenatal screening of mother: Secondary | ICD-10-CM | POA: Diagnosis present

## 2018-05-18 DIAGNOSIS — O9989 Other specified diseases and conditions complicating pregnancy, childbirth and the puerperium: Secondary | ICD-10-CM

## 2018-05-18 DIAGNOSIS — Z3400 Encounter for supervision of normal first pregnancy, unspecified trimester: Secondary | ICD-10-CM

## 2018-05-18 DIAGNOSIS — Z283 Underimmunization status: Secondary | ICD-10-CM

## 2018-05-18 DIAGNOSIS — O09899 Supervision of other high risk pregnancies, unspecified trimester: Secondary | ICD-10-CM

## 2018-05-18 DIAGNOSIS — Z2839 Other underimmunization status: Secondary | ICD-10-CM

## 2018-05-18 LAB — TYPE AND SCREEN
ABO/RH(D): A POS
Antibody Screen: NEGATIVE

## 2018-05-18 LAB — CBC
HCT: 30.8 % — ABNORMAL LOW (ref 36.0–46.0)
Hemoglobin: 9.9 g/dL — ABNORMAL LOW (ref 12.0–15.0)
MCH: 27.8 pg (ref 26.0–34.0)
MCHC: 32.1 g/dL (ref 30.0–36.0)
MCV: 86.5 fL (ref 78.0–100.0)
Platelets: 182 10*3/uL (ref 150–400)
RBC: 3.56 MIL/uL — ABNORMAL LOW (ref 3.87–5.11)
RDW: 14.1 % (ref 11.5–15.5)
WBC: 7.4 10*3/uL (ref 4.0–10.5)

## 2018-05-18 LAB — ABO/RH: ABO/RH(D): A POS

## 2018-05-18 MED ORDER — LACTATED RINGERS IV SOLN
INTRAVENOUS | Status: DC
  Administered 2018-05-18 (×2): via INTRAVENOUS

## 2018-05-18 MED ORDER — ACETAMINOPHEN 325 MG PO TABS: 650.0000 mg | ORAL_TABLET | ORAL | Status: DC | PRN

## 2018-05-18 MED ORDER — SOD CITRATE-CITRIC ACID 500-334 MG/5ML PO SOLN: 30.0000 mL | ORAL | Status: DC | PRN

## 2018-05-18 MED ORDER — OXYTOCIN 40 UNITS IN LACTATED RINGERS INFUSION - SIMPLE MED
2.5000 [IU]/h | INTRAVENOUS | Status: DC
  Administered 2018-05-19: 2.5 [IU]/h via INTRAVENOUS
  Filled 2018-05-18: qty 1000

## 2018-05-18 MED ORDER — OXYTOCIN BOLUS FROM INFUSION
500.0000 mL | Freq: Once | INTRAVENOUS | Status: AC
  Administered 2018-05-19: 500 mL via INTRAVENOUS

## 2018-05-18 MED ORDER — LIDOCAINE HCL (PF) 1 % IJ SOLN
INTRAMUSCULAR | Status: DC | PRN
  Administered 2018-05-18: 10 mL via EPIDURAL

## 2018-05-18 MED ORDER — EPHEDRINE 5 MG/ML INJ
10.0000 mg | INTRAVENOUS | Status: DC | PRN
  Filled 2018-05-18: qty 2

## 2018-05-18 MED ORDER — PHENYLEPHRINE 40 MCG/ML (10ML) SYRINGE FOR IV PUSH (FOR BLOOD PRESSURE SUPPORT)
80.0000 ug | PREFILLED_SYRINGE | INTRAVENOUS | Status: DC | PRN
  Filled 2018-05-18: qty 5
  Filled 2018-05-18: qty 10

## 2018-05-18 MED ORDER — ONDANSETRON HCL 4 MG/2ML IJ SOLN: 4.0000 mg | Freq: Four times a day (QID) | INTRAMUSCULAR | Status: DC | PRN

## 2018-05-18 MED ORDER — LIDOCAINE HCL (PF) 1 % IJ SOLN
30.0000 mL | INTRAMUSCULAR | Status: DC | PRN
  Filled 2018-05-18: qty 30

## 2018-05-18 MED ORDER — DIPHENHYDRAMINE HCL 50 MG/ML IJ SOLN: 12.5000 mg | INTRAMUSCULAR | Status: DC | PRN

## 2018-05-18 MED ORDER — FENTANYL CITRATE (PF) 100 MCG/2ML IJ SOLN
50.0000 ug | INTRAMUSCULAR | Status: DC | PRN
  Administered 2018-05-18 (×2): 50 ug via INTRAVENOUS
  Filled 2018-05-18 (×2): qty 2

## 2018-05-18 MED ORDER — OXYCODONE-ACETAMINOPHEN 5-325 MG PO TABS: 1.0000 | ORAL_TABLET | ORAL | Status: DC | PRN

## 2018-05-18 MED ORDER — LACTATED RINGERS IV SOLN
500.0000 mL | Freq: Once | INTRAVENOUS | Status: AC
  Administered 2018-05-18: 500 mL via INTRAVENOUS

## 2018-05-18 MED ORDER — LACTATED RINGERS IV SOLN: 500.0000 mL | INTRAVENOUS | Status: DC | PRN

## 2018-05-18 MED ORDER — FENTANYL 2.5 MCG/ML BUPIVACAINE 1/10 % EPIDURAL INFUSION (WH - ANES)
14.0000 mL/h | INTRAMUSCULAR | Status: DC | PRN
  Administered 2018-05-18: 14 mL/h via EPIDURAL
  Filled 2018-05-18: qty 100

## 2018-05-18 MED ORDER — PHENYLEPHRINE 40 MCG/ML (10ML) SYRINGE FOR IV PUSH (FOR BLOOD PRESSURE SUPPORT)
80.0000 ug | PREFILLED_SYRINGE | INTRAVENOUS | Status: DC | PRN
  Filled 2018-05-18: qty 5

## 2018-05-18 MED ORDER — OXYCODONE-ACETAMINOPHEN 5-325 MG PO TABS: 2.0000 | ORAL_TABLET | ORAL | Status: DC | PRN

## 2018-05-18 NOTE — H&P (Signed)
Margaret Goodman is a 22 y.o. female presenting for SOL. OB History    Gravida  1   Para      Term      Preterm      AB      Living        SAB      TAB      Ectopic      Multiple      Live Births             Past Medical History:  Diagnosis Date  . Hyperlipidemia    Past Surgical History:  Procedure Laterality Date  . NO PAST SURGERIES     Family History: family history is not on file. Social History:  reports that she has never smoked. She has never used smokeless tobacco. She reports that she does not drink alcohol or use drugs.   Nursing Staff Provider  Office Location  [facility] Dating  LMP  Language   english Anatomy US  Female fetus: f/u anatomy normal  Flu Vaccine  Declned 04/24/2018 Genetic Screen  NIPS:Panorama: Neg   AFP:   neg   TDaP vaccine   Declined 02/1218 Hgb A1C or  GTT Early  Third trimester 71 99 81  Rhogam  N/a A+   LAB RESULTS   Contraception Pill  Blood Type A/Positive/-- (03/19 1521)   Circumcision Yes  Antibody Negative (03/19 1521)  Pediatrician [facility] Rubella <0.90 (03/19 1521)  Support Person  FOB  RPR Non Reactive (07/12 1012)   Prenatal Classes Pt is interested  HBsAg Negative (03/19 1521)   BTL Consent  HIV Non Reactive (07/12 1012)  VBAC Consent n/a GBS  (For PCN allergy, check sensitivities) NEG 05/03/2018    Pap 11/06/17: negative +low risk HPV    Hgb Electro  Normal    CF     SMA     Waterbirth  [ ]  Class [ ]  Consent [ ]  CNM visit       Maternal Diabetes: No Genetic Screening: Normal Maternal Ultrasounds/Referrals: Normal Fetal Ultrasounds or other Referrals:  None Maternal Substance Abuse:  No Significant Maternal Medications:  None Significant Maternal Lab Results:  None Other Comments:  None  Review of Systems  All other systems reviewed and are negative.  Maternal Medical History:  Reason for admission: Contractions.   Contractions: Onset was 6-12 hours ago.   Perceived severity is moderate.     Fetal activity: Perceived fetal activity is normal.   Last perceived fetal movement was within the past hour.    Prenatal complications: no prenatal complications Prenatal Complications - Diabetes: none.    Dilation: 3.5 Effacement (%): 90 Station: -2 Exam by:: ginger morris rn Blood pressure 134/80, pulse 77, temperature 98.2 F (36.8 C), resp. rate 16, last menstrual period 08/20/2017. Maternal Exam:  Uterine Assessment: Contraction strength is moderate.  Contraction frequency is regular.   Abdomen: Patient reports no abdominal tenderness. Introitus: Normal vulva. Normal vagina.  Cervix: Cervix evaluated by digital exam.     Fetal Exam Fetal Monitor Review: Mode: ultrasound.   Baseline rate: 150.  Variability: moderate (6-25 bpm).   Pattern: accelerations present and no decelerations.    Fetal State Assessment: Category I - tracings are normal.     Physical Exam  Nursing note and vitals reviewed. Constitutional: She is oriented to person, place, and time. She appears well-developed and well-nourished. No distress.  HENT:  Head: Normocephalic.  Cardiovascular: Normal rate.  Respiratory: Effort normal.  GI:  Soft. There is no tenderness. There is no rebound.  Neurological: She is alert and oriented to person, place, and time.  Skin: Skin is warm and dry.  Psychiatric: She has a normal mood and affect.    Prenatal labs: ABO, Rh: A/Positive/-- (03/19 1521) Antibody: Negative (03/19 1521) Rubella: <0.90 (03/19 1521) RPR: Non Reactive (07/12 1012)  HBsAg: Negative (03/19 1521)  HIV: Non Reactive (07/12 1012)  GBS:   NEGATIVE   Assessment/Plan: 22 y.o. G1P0 at [redacted]w[redacted]d  Early/active labor with non-reactive NST Admit to labor and delivery  Pain management PRN Augment PRN Anticipate NSVD   Thressa Sheller 05/18/2018, 5:22 PM

## 2018-05-18 NOTE — Anesthesia Preprocedure Evaluation (Signed)
Anesthesia Evaluation  Patient identified by MRN, date of birth, ID band Patient awake    Reviewed: Allergy & Precautions, H&P , NPO status , Patient's Chart, lab work & pertinent test results  History of Anesthesia Complications Negative for: history of anesthetic complications  Airway Mallampati: II  TM Distance: >3 FB Neck ROM: full    Dental no notable dental hx.    Pulmonary neg pulmonary ROS,    Pulmonary exam normal        Cardiovascular negative cardio ROS Normal cardiovascular exam Rhythm:regular Rate:Normal     Neuro/Psych negative neurological ROS  negative psych ROS   GI/Hepatic Neg liver ROS, GERD  ,  Endo/Other  negative endocrine ROS  Renal/GU negative Renal ROS  negative genitourinary   Musculoskeletal   Abdominal   Peds  Hematology negative hematology ROS (+)   Anesthesia Other Findings   Reproductive/Obstetrics (+) Pregnancy                             Anesthesia Physical Anesthesia Plan  ASA: II  Anesthesia Plan: Epidural   Post-op Pain Management:    Induction:   PONV Risk Score and Plan:   Airway Management Planned:   Additional Equipment:   Intra-op Plan:   Post-operative Plan:   Informed Consent: I have reviewed the patients History and Physical, chart, labs and discussed the procedure including the risks, benefits and alternatives for the proposed anesthesia with the patient or authorized representative who has indicated his/her understanding and acceptance.       Plan Discussed with:   Anesthesia Plan Comments:         Anesthesia Quick Evaluation  

## 2018-05-18 NOTE — MAU Note (Signed)
Pt presents to MAU with contractions throughout the night with more regular contractions today. Denies any vaginal bleeding or LOF

## 2018-05-18 NOTE — Progress Notes (Signed)
LABOR PROGRESS NOTE  Margaret Goodman is a 22 y.o. G1P0 at [redacted]w[redacted]d  admitted for SOL.   Subjective: Strip note.   Objective: BP 117/74   Pulse 67   Temp 98.1 F (36.7 C) (Oral)   Resp 20   Ht 5\' 7"  (1.702 m)   Wt 96.7 kg   LMP 08/20/2017   SpO2 99%   BMI 33.38 kg/m  or  Vitals:   05/18/18 2100 05/18/18 2132 05/18/18 2201 05/18/18 2230  BP: (!) 131/91 (!) 98/47 (!) 104/50 117/74  Pulse: 81 62 68 67  Resp:      Temp:      TempSrc:      SpO2: 99%     Weight:      Height:        Dilation: 8 Effacement (%): 90 Cervical Position: Middle Station: -1 Presentation: Vertex Exam by:: Barnicle RN FHT: baseline rate 120, moderate varibility, +acel, no decel Toco: q1-3 min   Labs: Lab Results  Component Value Date   WBC 7.4 05/18/2018   HGB 9.9 (L) 05/18/2018   HCT 30.8 (L) 05/18/2018   MCV 86.5 05/18/2018   PLT 182 05/18/2018    Patient Active Problem List   Diagnosis Date Noted  . Non-reactive NST (non-stress test) 05/18/2018  . Vitamin D deficiency 11/19/2017  . Rubella non-immune status, antepartum 11/19/2017  . Supervision of normal first pregnancy, antepartum 11/06/2017    Assessment / Plan: 22 y.o. G1P0 at [redacted]w[redacted]d here for SOL.   Labor: Expectant management. Has had excellent cervical progression. If stalls will plan for AROM.  Fetal Wellbeing:  Cat I  Pain Control:  Epidural in place  Anticipated MOD:  NSVD   Marcy Siren, D.O. OB Fellow  05/18/2018, 11:02 PM

## 2018-05-18 NOTE — Anesthesia Procedure Notes (Signed)
Epidural Patient location during procedure: OB Start time: 05/18/2018 8:03 PM End time: 05/18/2018 8:14 PM  Staffing Anesthesiologist: Lucretia Kern, MD Performed: anesthesiologist   Preanesthetic Checklist Completed: patient identified, pre-op evaluation, timeout performed, IV checked, risks and benefits discussed and monitors and equipment checked  Epidural Patient position: sitting Prep: DuraPrep Patient monitoring: heart rate, continuous pulse ox and blood pressure Approach: midline Location: L3-L4 Injection technique: LOR saline  Needle:  Needle type: Tuohy  Needle gauge: 17 G Needle length: 9 cm Needle insertion depth: 7 cm Catheter type: closed end flexible Catheter size: 19 Gauge Catheter at skin depth: 11 cm  Assessment Events: blood not aspirated, injection not painful, no injection resistance, negative IV test and no paresthesia  Additional Notes Reason for block:procedure for pain

## 2018-05-19 ENCOUNTER — Encounter (HOSPITAL_COMMUNITY): Payer: Self-pay

## 2018-05-19 DIAGNOSIS — Z3A38 38 weeks gestation of pregnancy: Secondary | ICD-10-CM

## 2018-05-19 LAB — RPR: RPR Ser Ql: NONREACTIVE

## 2018-05-19 MED ORDER — ZOLPIDEM TARTRATE 5 MG PO TABS: 5.0000 mg | ORAL_TABLET | Freq: Every evening | ORAL | Status: DC | PRN

## 2018-05-19 MED ORDER — MEASLES, MUMPS & RUBELLA VAC ~~LOC~~ INJ
0.5000 mL | INJECTION | Freq: Once | SUBCUTANEOUS | Status: AC
  Administered 2018-05-20: 0.5 mL via SUBCUTANEOUS
  Filled 2018-05-19 (×2): qty 0.5

## 2018-05-19 MED ORDER — IBUPROFEN 600 MG PO TABS
600.0000 mg | ORAL_TABLET | Freq: Four times a day (QID) | ORAL | Status: DC
  Administered 2018-05-19 – 2018-05-20 (×6): 600 mg via ORAL
  Filled 2018-05-19 (×6): qty 1

## 2018-05-19 MED ORDER — ONDANSETRON HCL 4 MG/2ML IJ SOLN: 4.0000 mg | INTRAMUSCULAR | Status: DC | PRN

## 2018-05-19 MED ORDER — DIBUCAINE 1 % RE OINT: 1.0000 "application " | TOPICAL_OINTMENT | RECTAL | Status: DC | PRN

## 2018-05-19 MED ORDER — BENZOCAINE-MENTHOL 20-0.5 % EX AERO: 1.0000 "application " | INHALATION_SPRAY | CUTANEOUS | Status: DC | PRN

## 2018-05-19 MED ORDER — WITCH HAZEL-GLYCERIN EX PADS: 1.0000 "application " | MEDICATED_PAD | CUTANEOUS | Status: DC | PRN

## 2018-05-19 MED ORDER — DIPHENHYDRAMINE HCL 25 MG PO CAPS: 25.0000 mg | ORAL_CAPSULE | Freq: Four times a day (QID) | ORAL | Status: DC | PRN

## 2018-05-19 MED ORDER — SIMETHICONE 80 MG PO CHEW: 80.0000 mg | CHEWABLE_TABLET | ORAL | Status: DC | PRN

## 2018-05-19 MED ORDER — ONDANSETRON HCL 4 MG PO TABS: 4.0000 mg | ORAL_TABLET | ORAL | Status: DC | PRN

## 2018-05-19 MED ORDER — TETANUS-DIPHTH-ACELL PERTUSSIS 5-2.5-18.5 LF-MCG/0.5 IM SUSP
0.5000 mL | Freq: Once | INTRAMUSCULAR | Status: AC
  Administered 2018-05-20: 0.5 mL via INTRAMUSCULAR
  Filled 2018-05-19: qty 0.5

## 2018-05-19 MED ORDER — COCONUT OIL OIL
1.0000 "application " | TOPICAL_OIL | Status: DC | PRN
  Administered 2018-05-19: 1 via TOPICAL
  Filled 2018-05-19: qty 120

## 2018-05-19 MED ORDER — SENNOSIDES-DOCUSATE SODIUM 8.6-50 MG PO TABS
2.0000 | ORAL_TABLET | ORAL | Status: DC
  Administered 2018-05-19: 2 via ORAL
  Filled 2018-05-19: qty 2

## 2018-05-19 MED ORDER — PRENATAL MULTIVITAMIN CH
1.0000 | ORAL_TABLET | Freq: Every day | ORAL | Status: DC
  Administered 2018-05-19 – 2018-05-20 (×2): 1 via ORAL
  Filled 2018-05-19 (×2): qty 1

## 2018-05-19 MED ORDER — ACETAMINOPHEN 325 MG PO TABS
650.0000 mg | ORAL_TABLET | ORAL | Status: DC | PRN
  Administered 2018-05-19 (×3): 650 mg via ORAL
  Filled 2018-05-19 (×3): qty 2

## 2018-05-19 NOTE — Plan of Care (Signed)
  Problem: Education: Goal: Knowledge of General Education information will improve Description Including pain rating scale, medication(s)/side effects and non-pharmacologic comfort measures Outcome: Progressing   Problem: Health Behavior/Discharge Planning: Goal: Ability to manage health-related needs will improve Outcome: Progressing   Problem: Clinical Measurements: Goal: Ability to maintain clinical measurements within normal limits will improve Outcome: Progressing Goal: Will remain free from infection Outcome: Progressing Goal: Diagnostic test results will improve Outcome: Progressing Goal: Respiratory complications will improve Outcome: Progressing Goal: Cardiovascular complication will be avoided Outcome: Progressing   Problem: Activity: Goal: Risk for activity intolerance will decrease Outcome: Progressing   Problem: Nutrition: Goal: Adequate nutrition will be maintained Outcome: Progressing   Problem: Coping: Goal: Level of anxiety will decrease Outcome: Progressing   Problem: Elimination: Goal: Will not experience complications related to bowel motility Outcome: Progressing Goal: Will not experience complications related to urinary retention Outcome: Progressing   Problem: Pain Managment: Goal: General experience of comfort will improve Outcome: Progressing   Problem: Safety: Goal: Ability to remain free from injury will improve Outcome: Progressing   Problem: Skin Integrity: Goal: Risk for impaired skin integrity will decrease Outcome: Progressing   Problem: Education: Goal: Knowledge of Childbirth will improve Outcome: Progressing Goal: Ability to make informed decisions regarding treatment and plan of care will improve Outcome: Progressing Goal: Ability to state and carry out methods to decrease the pain will improve Outcome: Progressing   Problem: Role Relationship: Goal: Ability to demonstrate positive interaction with the child will  improve Outcome: Progressing   Problem: Safety: Goal: Risk of complications during labor and delivery will decrease Outcome: Progressing

## 2018-05-19 NOTE — Anesthesia Postprocedure Evaluation (Signed)
Anesthesia Post Note  Patient: Melony Overly  Procedure(s) Performed: AN AD HOC LABOR EPIDURAL     Patient location during evaluation: Mother Baby Anesthesia Type: Epidural Level of consciousness: awake and alert and oriented Pain management: satisfactory to patient Vital Signs Assessment: post-procedure vital signs reviewed and stable Respiratory status: respiratory function stable Cardiovascular status: stable Postop Assessment: no headache, no backache, epidural receding, patient able to bend at knees, no signs of nausea or vomiting and adequate PO intake Anesthetic complications: no    Last Vitals:  Vitals:   05/19/18 0309 05/19/18 0356  BP: 114/69 105/67  Pulse: 69 70  Resp: 18 18  Temp: 37.3 C 37 C  SpO2: 100% 100%    Last Pain:  Vitals:   05/19/18 0545  TempSrc:   PainSc: 2    Pain Goal:                 Noya Santarelli

## 2018-05-19 NOTE — Lactation Note (Signed)
This note was copied from a baby's chart. Lactation Consultation Note  Patient Name: Margaret Goodman [id number] Date: 05/19/2018 Reason for consult: Initial assessment   P1, Baby sleeping.  Room full of visitors. Mother states she is having trouble latching on R side. Provided mother w/ manual pump to prepump and suggest she hand express before latching. LC will follow up later today. Discussed basics. Mom made aware of O/P services, breastfeeding support groups, community resources, and our phone # for post-discharge questions.     Maternal Data Has patient been taught Hand Expression?: Yes Does the patient have breastfeeding experience prior to this delivery?: No  Feeding    LATCH Score                   Interventions Interventions: Breast feeding basics reviewed;Hand pump  Lactation Tools Discussed/Used     Consult Status Consult Status: Follow-up Date: 05/20/18 Follow-up type: In-patient    Dahlia Byes [facility] 05/19/2018, 2:15 PM

## 2018-05-20 MED ORDER — NORGESTIMATE-ETH ESTRADIOL 0.25-35 MG-MCG PO TABS: 1.0000 | ORAL_TABLET | Freq: Every day | ORAL | 4 refills | Status: DC

## 2018-05-20 MED ORDER — IBUPROFEN 600 MG PO TABS: 600.0000 mg | ORAL_TABLET | Freq: Three times a day (TID) | ORAL | 0 refills | Status: DC

## 2018-05-20 NOTE — Lactation Note (Signed)
This note was copied from a baby's chart. Lactation Consultation Note  Patient Name: Margaret Goodman [id number] Date: 05/20/2018 Reason for consult: Follow-up assessment  Visited with P1 Mom of ET baby at 32 hrs old, baby at 5% weight loss.  Mom states baby has been latching well, but still having difficulty latching on right breast.   Offered to assist with positioning and latching on right breast, but Mom stated baby just finished feeding 10 mins ago.  Suggested he may want to feed on 2nd side as he is in his second day of life.   Reviewed breast massage and hand expression on right breast.   Mom to call when baby begins cueing to eat again.  Phone number written on dry erase board. Mom aware of OP lactation services available to her.  Consult Status Consult Status: Follow-up Date: 05/20/18 Follow-up type: In-patient    Judee Clara 05/20/2018, 10:16 AM

## 2018-05-20 NOTE — Discharge Summary (Addendum)
Obstetrics Discharge Summary OB/GYN Faculty [facility]   Patient Name: Margaret Goodman DOB: [birth year] MRN: [id number]  Date of admission: 05/18/2018 Delivering MD: Nicolette Bang   Date of discharge: 05/20/2018  Admitting diagnosis: 38 WKS, CTXS Intrauterine pregnancy: [redacted]w[redacted]d    Secondary diagnosis:   Active Problems:   Rubella non-immune status, antepartum   Non-reactive NST (non-stress test)    Discharge diagnosis: Term Pregnancy Delivered                               Postpartum procedures: MMR ordered to be given prior to discharge Complications: none  Hospital course: Margaret Goodman was admitted for spontaneous onset of labor. Her pregnancy was complicated by rubella non-immune status. Her labor course was notable for AROM. Delivery was complicated by none. Please see delivery/op note for additional details. Her postpartum course was uncomplicated. She was bottle and breastfeeding without difficulty. By day of discharge, she was passing flatus, urinating, eating and drinking without difficulty. Her pain was well-controlled, and she was discharged home with ibuprofen. She will follow-up in clinic in 4-6 weeks.   Physical exam  Vitals:   05/19/18 0356 05/19/18 1100 05/19/18 1956 05/20/18 0532  BP: 105/67 106/70 106/60 114/81  Pulse: 70 87 69 65  Resp: 18 18 18 18   Temp: 98.6 F (37 C) 97.7 F (36.5 C) 97.6 F (36.4 C) 98 F (36.7 C)  TempSrc: Oral Oral Oral Oral  SpO2: 100% 98% 100% 100%  Weight:      Height:       General: well-appearing, NAD Lochia: appropriate Uterine Fundus: firm Incision: N/A DVT Evaluation: No evidence of DVT seen on physical exam. Labs: Lab Results  Component Value Date   WBC 7.4 05/18/2018   HGB 9.9 (L) 05/18/2018   HCT 30.8 (L) 05/18/2018   MCV 86.5 05/18/2018   PLT 182 05/18/2018   No flowsheet data found.  Discharge instructions: Per After Visit Summary and "Baby and Me Booklet"  After visit meds:   Allergies as of 05/20/2018      Reactions   Tamiflu [oseltamivir Phosphate] Hives      Medication List    TAKE these medications   ibuprofen 600 MG tablet Commonly known as:  ADVIL,MOTRIN Take 1 tablet (600 mg total) by mouth every 8 (eight) hours.   norgestimate-ethinyl estradiol 0.25-35 MG-MCG tablet Commonly known as:  ORTHO-CYCLEN,SPRINTEC,PREVIFEM Take 1 tablet by mouth daily. Do not start before 6 weeks postpartum.   omeprazole 20 MG capsule Commonly known as:  PRILOSEC Take 1 capsule (20 mg total) by mouth 2 (two) times daily before a meal.   VITAFOL GUMMIES 3.33-0.333-34.8 MG Chew Chew 3 each by mouth daily.   Vitamin D (Ergocalciferol) 50000 units Caps capsule Commonly known as:  DRISDOL Take 1 capsule (50,000 Units total) by mouth every 7 (seven) days.       Postpartum contraception: Combination OCPs Diet: Routine Diet Activity: Advance as tolerated. Pelvic rest for 6 weeks.   Outpatient follow up: 4-6 weeks, message sent to schedule appt  Follow-up Appt: Future Appointments  Date Time Provider [place]10/10/2017  2:00 PM RoLajean ManesCNM [facility] None   Newborn Data: Live born female  Birth Weight: 7 lb 5.1 oz (3320 g) APGAR: 7, 9  Newborn Delivery   Birth date/time:  05/19/2018 01:14:00 Delivery type:  Vaginal, Spontaneous     Baby Feeding: Bottle and Breast  Disposition:home with mother  Lambert Mody. Juleen China, DO OB/GYN Fellow, Faculty Practice

## 2018-05-20 NOTE — Progress Notes (Signed)
Post Partum Day 1 Subjective: Margaret Goodman is a G53P1001 female who is PPD#1 after spontaneous vaginal delivery at [redacted]w[redacted]d. Patient reports she is doing "good". Patient reports that she has been up out of bed since delivery. Patient reports eating without nausea. Patient reports flatus but denies bowel movement. Denies headache, RUQ pain, and vision change. She reports that her bleeding has lessened but is still more than a period. She is currently breastfeeding and requests POP for birth control. She is planning outpatient circumcision. She states she would like to go home today after the baby's blood tests are back and he has a hearing test.  Objective: Blood pressure 114/81, pulse 65, temperature 98 F (36.7 C), temperature source Oral, resp. rate 18, height 5\' 7"  (1.702 m), weight 96.7 kg, last menstrual period 08/20/2017, SpO2 100 %, unknown if currently breastfeeding.  Physical Exam:  General: alert and cooperative  Heart: Regular rate and rhythm without murmurs.  Lungs: Clear to auscultation.  Lochia: appropriate Uterine Fundus: firm, palpable DVT Evaluation: Negative Homan's sign. No tenderness to palpation of bilateral calf/ankle. No significant calf/ankle edema.  Recent Labs    05/18/18 1655  HGB 9.9*  HCT 30.8*    Assessment/Plan: Assessment: Margaret Goodman is PPD#1 after spontaneous vaginal delivery. She appears to be in stable condition. She is tolerating PO intake and ambulation well.  Plan: Continue to monitor for increased bleeding or pain. Plan is to discharge to home later today.   LOS: 2 days   Charyl Dancer 05/20/2018, 7:39 AM

## 2018-05-21 ENCOUNTER — Telehealth: Payer: Self-pay | Admitting: *Deleted

## 2018-05-21 NOTE — Telephone Encounter (Signed)
Pt called to office with questions about returning to school after delivery.  Return call to pt.  Pt states she has a final exam next week and would like to know if she will be able to go take exam.  Pt made aware that if she is feeling well enough at that time, she should be able to go for exam.  Pt states she has no other classes until this time and will then have about a 2 week break in between classes.  Pt advised to determine how she feels but to use that 2 week break to recoup from delivery. Pt made aware that if she is feeling well once classes start back she may return to school. Pt advised if any thing changes she may call office to discuss or make appt as needed.  Pt states understanding.

## 2018-05-23 ENCOUNTER — Encounter: Payer: Managed Care, Other (non HMO) | Admitting: Certified Nurse Midwife

## 2018-06-17 ENCOUNTER — Ambulatory Visit: Payer: Managed Care, Other (non HMO) | Admitting: Advanced Practice Midwife

## 2018-07-04 ENCOUNTER — Encounter: Payer: Self-pay | Admitting: Obstetrics and Gynecology

## 2018-07-04 ENCOUNTER — Ambulatory Visit (INDEPENDENT_AMBULATORY_CARE_PROVIDER_SITE_OTHER): Payer: Managed Care, Other (non HMO) | Admitting: Obstetrics and Gynecology

## 2018-07-04 DIAGNOSIS — Z30011 Encounter for initial prescription of contraceptive pills: Secondary | ICD-10-CM

## 2018-07-04 DIAGNOSIS — R87618 Other abnormal cytological findings on specimens from cervix uteri: Secondary | ICD-10-CM

## 2018-07-04 MED ORDER — NORGESTIMATE-ETH ESTRADIOL 0.25-35 MG-MCG PO TABS: 1.0000 | ORAL_TABLET | Freq: Every day | ORAL | 4 refills | Status: DC

## 2018-07-04 NOTE — Progress Notes (Signed)
Obstetrics/Postpartum Visit  Appointment Date: 07/04/2018  [facility]  Primary Care Provider: Patient, No Pcp Per  Chief Complaint:  Chief Complaint  Patient presents with  . Postpartum Care    History of Present Illness: Margaret Goodman is a 22 y.o. African-American G1P1001 (No LMP recorded.), seen for the above chief complaint. Her past medical history is significant for n/a.  She is s/p SVD on 05/19/18 at 38 weeks; she was discharged to home on PPD#1. Pregnancy complicated by n/a.  Complains of occasional sharp, shooting pain in breasts bilaterally after pumping.   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   Her 12 point review of systems is negative or as noted in the History of Present Illness.  Patient Active Problem List   Diagnosis Date Noted  . Non-reactive NST (non-stress test) 05/18/2018  . Vitamin D deficiency 11/19/2017  . Rubella non-immune status, antepartum 11/19/2017  . Supervision of normal first pregnancy, antepartum 11/06/2017   Medications Margaret Goodman "Margaret Goodman" had no medications administered during this visit. Current Outpatient Medications  Medication Sig Dispense Refill  . Prenatal Vit-Fe Phos-FA-Omega (VITAFOL GUMMIES) 3.33-0.333-34.8 MG CHEW Chew 3 each by mouth daily. 90 tablet 11  . ibuprofen (ADVIL,MOTRIN) 600 MG tablet Take 1 tablet (600 mg total) by mouth every 8 (eight) hours. (Patient not taking: Reported on 07/04/2018) 30 tablet 0  . norgestimate-ethinyl estradiol (ORTHO-CYCLEN,SPRINTEC,PREVIFEM) 0.25-35 MG-MCG tablet Take 1 tablet by mouth daily. Do not start before 6 weeks postpartum. 3 Package 4  . omeprazole (PRILOSEC) 20 MG capsule Take 1 capsule (20 mg total)  by mouth 2 (two) times daily before a meal. (Patient not taking: Reported on 07/04/2018) 60 capsule 5  . Vitamin D, Ergocalciferol, (DRISDOL) 50000 units CAPS capsule Take 1 capsule (50,000 Units total) by mouth every 7 (seven) days. (Patient not taking: Reported on 07/04/2018) 30 capsule 2   No current facility-administered medications for this visit.    Allergies Tamiflu [oseltamivir phosphate]  Physical Exam:  BP 118/72   Pulse 84   Wt 196 lb (88.9 kg)   BMI 30.70 kg/m  Body mass index is 30.7 kg/m. General appearance: Well nourished, well developed female in no acute distress.  Cardiovascular: regular rate and rhythm Respiratory:  Clear to auscultation bilateral. Normal respiratory effort Abdomen: no masses, hernias; diffusely non tender to palpation, non distended Breasts: not examined. Neuro/Psych:  Normal mood and affect.  Skin:  Warm and dry.    PP Depression Screening:   Edinburgh Postnatal Depression Scale - 07/04/18 1411      Edinburgh Postnatal Depression Scale:  In the Past 7 Days   I have been able to laugh and see the funny side of things.  0    I have looked forward with enjoyment to things.  0    I have blamed myself unnecessarily when things went wrong.  0    I have been anxious or worried for no good reason.  1    I have felt scared or panicky for no good reason.  0    Things have been getting on top of me.  0    I have been so unhappy that I have had difficulty sleeping.  0    I have felt sad  or miserable.  0    I have been so unhappy that I have been crying.  0    The thought of harming myself has occurred to me.  0    Edinburgh Postnatal Depression Scale Total  1        Assessment: Patient is a 22 y.o. G1P1001 who is 6 weeks post partum from a spontaneous vaginal delivery. She is doing well with no issues.   Plan:  1. Postpartum state Doing well, no issues  2. 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 Encounter for initial prescription of contraceptive pills sprintec sent to pharmacy    RTC for annual or prn   K. Therese Sarah, M.D. [facility] [employer]

## 2018-08-06 ENCOUNTER — Telehealth: Payer: Self-pay | Admitting: *Deleted

## 2018-08-06 NOTE — Telephone Encounter (Signed)
Pt called to office to have birth control reordered to pharmacy as she did not pick up when initially sent.  Rx was called to pharmacy to fill.  LM on pt VM making her aware of Rx order, she needs to start pills with cycle if she is having and/or take upt if she is sexually active.

## 2019-06-10 ENCOUNTER — Encounter: Payer: Self-pay | Admitting: Obstetrics

## 2019-06-10 ENCOUNTER — Ambulatory Visit (INDEPENDENT_AMBULATORY_CARE_PROVIDER_SITE_OTHER): Payer: Managed Care, Other (non HMO) | Admitting: Obstetrics

## 2019-06-10 ENCOUNTER — Other Ambulatory Visit: Payer: Self-pay

## 2019-06-10 VITALS — BP 117/72 | HR 80 | Ht 67.0 in | Wt 201.0 lb

## 2019-06-10 DIAGNOSIS — Z124 Encounter for screening for malignant neoplasm of cervix: Secondary | ICD-10-CM | POA: Diagnosis not present

## 2019-06-10 DIAGNOSIS — Z01419 Encounter for gynecological examination (general) (routine) without abnormal findings: Secondary | ICD-10-CM | POA: Diagnosis not present

## 2019-06-10 DIAGNOSIS — B373 Candidiasis of vulva and vagina: Secondary | ICD-10-CM | POA: Diagnosis not present

## 2019-06-10 DIAGNOSIS — N898 Other specified noninflammatory disorders of vagina: Secondary | ICD-10-CM

## 2019-06-10 DIAGNOSIS — Z30011 Encounter for initial prescription of contraceptive pills: Secondary | ICD-10-CM

## 2019-06-10 DIAGNOSIS — Z3009 Encounter for other general counseling and advice on contraception: Secondary | ICD-10-CM

## 2019-06-10 DIAGNOSIS — Z113 Encounter for screening for infections with a predominantly sexual mode of transmission: Secondary | ICD-10-CM

## 2019-06-10 MED ORDER — NORETHINDRONE 0.35 MG PO TABS: 1.0000 | ORAL_TABLET | Freq: Every day | ORAL | 11 refills | Status: DC

## 2019-06-10 NOTE — Progress Notes (Signed)
Subjective:        Margaret Goodman is a 23 y.o. female here for a routine exam.  Current complaints: None  Personal health questionnaire:  Is patient Ashkenazi Jewish, have a family history of breast and/or ovarian cancer: no Is there a family history of uterine cancer diagnosed at age < 40, gastrointestinal cancer, urinary tract cancer, family member who is a Personnel officer syndrome-associated carrier: no Is the patient overweight and hypertensive, family history of diabetes, personal history of gestational diabetes, preeclampsia or PCOS: no Is patient over 71, have PCOS,  family history of premature CHD under age 24, diabetes, smoke, have hypertension or peripheral artery disease:  no At any time, has a partner hit, kicked or otherwise hurt or frightened you?: no Over the past 2 weeks, have you felt down, depressed or hopeless?: no Over the past 2 weeks, have you felt little interest or pleasure in doing things?:no   Gynecologic History No LMP recorded. Contraception: none Last Pap: 11-06-2017. Results were: normal Last mammogram: n/a. Results were: n/a  Obstetric History OB History  Gravida Para Term Preterm AB Living  1 1 1     1   SAB TAB Ectopic Multiple Live Births        0 1    # Outcome Date GA Lbr Len/2nd Weight Sex Delivery Anes PTL Lv  1 Term 05/19/18 [redacted]w[redacted]d 18:36 / 01:53 7 lb 5.1 oz (3.32 kg) M Vag-Spont EPI  LIV    Past Medical History:  Diagnosis Date  . Hyperlipidemia     Past Surgical History:  Procedure Laterality Date  . NO PAST SURGERIES       Current Outpatient Medications:  .  ibuprofen (ADVIL,MOTRIN) 600 MG tablet, Take 1 tablet (600 mg total) by mouth every 8 (eight) hours. (Patient not taking: Reported on 07/04/2018), Disp: 30 tablet, Rfl: 0 .  norethindrone (MICRONOR) 0.35 MG tablet, Take 1 tablet (0.35 mg total) by mouth daily., Disp: 1 Package, Rfl: 11 .  norgestimate-ethinyl estradiol (ORTHO-CYCLEN,SPRINTEC,PREVIFEM) 0.25-35 MG-MCG tablet, Take 1  tablet by mouth daily. Do not start before 6 weeks postpartum. (Patient not taking: Reported on 06/10/2019), Disp: 3 Package, Rfl: 4 .  omeprazole (PRILOSEC) 20 MG capsule, Take 1 capsule (20 mg total) by mouth 2 (two) times daily before a meal. (Patient not taking: Reported on 07/04/2018), Disp: 60 capsule, Rfl: 5 Allergies  Allergen Reactions  . Tamiflu [Oseltamivir Phosphate] Hives    Social History   Tobacco Use  . Smoking status: Never Smoker  . Smokeless tobacco: Never Used  Substance Use Topics  . Alcohol use: No    Frequency: Never    History reviewed. No pertinent family history.    Review of Systems  Constitutional: negative for fatigue and weight loss Respiratory: negative for cough and wheezing Cardiovascular: negative for chest pain, fatigue and palpitations Gastrointestinal: negative for abdominal pain and change in bowel habits Musculoskeletal:negative for myalgias Neurological: negative for gait problems and tremors Behavioral/Psych: negative for abusive relationship, depression Endocrine: negative for temperature intolerance    Genitourinary:negative for abnormal menstrual periods, genital lesions, hot flashes, sexual problems and vaginal discharge Integument/breast: negative for breast lump, breast tenderness, nipple discharge and skin lesion(s)    Objective:       BP 117/72   Pulse 80   Ht 5\' 7"  (1.702 m)   Wt 201 lb (91.2 kg)   BMI 31.48 kg/m  General:   alert  Skin:   no rash or abnormalities  Lungs:   clear  to auscultation bilaterally  Heart:   regular rate and rhythm, S1, S2 normal, no murmur, click, rub or gallop  Breasts:   normal without suspicious masses, skin or nipple changes or axillary nodes  Abdomen:  normal findings: no organomegaly, soft, non-tender and no hernia  Pelvis:  External genitalia: normal general appearance Urinary system: urethral meatus normal and bladder without fullness, nontender Vaginal: normal without tenderness,  induration or masses Cervix: normal appearance Adnexa: normal bimanual exam Uterus: anteverted and non-tender, normal size   Lab Review Urine pregnancy test Labs reviewed yes Radiologic studies reviewed no  50% of 25 min visit spent on counseling and coordination of care.   Assessment:     1. Encounter for routine gynecological examination with Papanicolaou smear of cervix Rx: - Cytology - PAP( [place])  2. Vaginal discharge Rx: - Cervicovaginal ancillary only( [place])  3. Encounter for other general counseling or advice on contraception - wants OCP's  4. Encounter for initial prescription of contraceptive pills ( Breast Feeding ) Rx: - norethindrone (MICRONOR) 0.35 MG tablet; Take 1 tablet (0.35 mg total) by mouth daily.  Dispense: 1 Package; Refill: 11    Plan:    Education reviewed: calcium supplements, depression evaluation, low fat, low cholesterol diet, safe sex/STD prevention, self breast exams and weight bearing exercise. Contraception: oral progesterone-only contraceptive. Follow up in: 1 year.   Meds ordered this encounter  Medications  . norethindrone (MICRONOR) 0.35 MG tablet    Sig: Take 1 tablet (0.35 mg total) by mouth daily.    Dispense:  1 Package    Refill:  11    Shelly Bombard, MD 06/10/2019 11:50 AM

## 2019-06-12 LAB — CYTOLOGY - PAP: Diagnosis: NEGATIVE

## 2019-06-18 LAB — CERVICOVAGINAL ANCILLARY ONLY
Bacterial Vaginitis (gardnerella): NEGATIVE
Candida Glabrata: NEGATIVE
Candida Vaginitis: POSITIVE — AB
Chlamydia: NEGATIVE
Comment: NEGATIVE
Comment: NEGATIVE
Comment: NEGATIVE
Comment: NEGATIVE
Comment: NEGATIVE
Comment: NORMAL
Neisseria Gonorrhea: NEGATIVE
Trichomonas: NEGATIVE

## 2019-06-19 ENCOUNTER — Other Ambulatory Visit: Payer: Self-pay | Admitting: Obstetrics

## 2019-06-19 DIAGNOSIS — B3731 Acute candidiasis of vulva and vagina: Secondary | ICD-10-CM

## 2019-06-19 DIAGNOSIS — B373 Candidiasis of vulva and vagina: Secondary | ICD-10-CM

## 2019-06-19 MED ORDER — FLUCONAZOLE 150 MG PO TABS: 150.0000 mg | ORAL_TABLET | Freq: Once | ORAL | 0 refills | Status: AC

## 2020-08-21 HISTORY — PX: DILATION AND CURETTAGE OF UTERUS: SHX78

## 2021-02-19 ENCOUNTER — Other Ambulatory Visit: Payer: Self-pay

## 2021-02-19 ENCOUNTER — Emergency Department (HOSPITAL_BASED_OUTPATIENT_CLINIC_OR_DEPARTMENT_OTHER)
Admission: EM | Admit: 2021-02-19 | Discharge: 2021-02-19 | Disposition: A | Discharge status: Home / Self Care | Payer: Commercial Managed Care - PPO | Attending: Emergency Medicine | Admitting: Emergency Medicine

## 2021-02-19 ENCOUNTER — Encounter (HOSPITAL_BASED_OUTPATIENT_CLINIC_OR_DEPARTMENT_OTHER): Payer: Self-pay | Admitting: *Deleted

## 2021-02-19 DIAGNOSIS — O210 Mild hyperemesis gravidarum: Secondary | ICD-10-CM | POA: Insufficient documentation

## 2021-02-19 DIAGNOSIS — E860 Dehydration: Secondary | ICD-10-CM | POA: Diagnosis not present

## 2021-02-19 DIAGNOSIS — E162 Hypoglycemia, unspecified: Secondary | ICD-10-CM | POA: Insufficient documentation

## 2021-02-19 DIAGNOSIS — O99810 Abnormal glucose complicating pregnancy: Secondary | ICD-10-CM | POA: Diagnosis not present

## 2021-02-19 DIAGNOSIS — O99281 Endocrine, nutritional and metabolic diseases complicating pregnancy, first trimester: Secondary | ICD-10-CM | POA: Insufficient documentation

## 2021-02-19 DIAGNOSIS — Z3A00 Weeks of gestation of pregnancy not specified: Secondary | ICD-10-CM | POA: Insufficient documentation

## 2021-02-19 LAB — COMPREHENSIVE METABOLIC PANEL
ALT: 15 U/L (ref 0–44)
AST: 16 U/L (ref 15–41)
Albumin: 4.4 g/dL (ref 3.5–5.0)
Alkaline Phosphatase: 37 U/L — ABNORMAL LOW (ref 38–126)
Anion gap: 13 (ref 5–15)
BUN: 11 mg/dL (ref 6–20)
CO2: 20 mmol/L — ABNORMAL LOW (ref 22–32)
Calcium: 9.5 mg/dL (ref 8.9–10.3)
Chloride: 101 mmol/L (ref 98–111)
Creatinine, Ser: 0.85 mg/dL (ref 0.44–1.00)
GFR, Estimated: >60 mL/min (ref >60)
Glucose, Bld: 58 mg/dL — ABNORMAL LOW (ref 70–99)
Potassium: 3.9 mmol/L (ref 3.5–5.1)
Sodium: 134 mmol/L — ABNORMAL LOW (ref 135–145)
Total Bilirubin: 0.6 mg/dL (ref 0.3–1.2)
Total Protein: 8.2 g/dL — ABNORMAL HIGH (ref 6.5–8.1)

## 2021-02-19 LAB — PREGNANCY, URINE: Preg Test, Ur: POSITIVE — AB

## 2021-02-19 LAB — CBC
HCT: 39.5 % (ref 36.0–46.0)
Hemoglobin: 13.8 g/dL (ref 12.0–15.0)
MCH: 32 pg (ref 26.0–34.0)
MCHC: 34.9 g/dL (ref 30.0–36.0)
MCV: 91.6 fL (ref 80.0–100.0)
Platelets: 243 10*3/uL (ref 150–400)
RBC: 4.31 MIL/uL (ref 3.87–5.11)
RDW: 11.8 % (ref 11.5–15.5)
WBC: 7.1 10*3/uL (ref 4.0–10.5)
nRBC: 0 % (ref 0.0–0.2)

## 2021-02-19 LAB — URINALYSIS, ROUTINE W REFLEX MICROSCOPIC
Bilirubin Urine: NEGATIVE
Glucose, UA: NEGATIVE mg/dL
Hgb urine dipstick: NEGATIVE
Ketones, ur: >80 mg/dL — AB
Leukocytes,Ua: NEGATIVE
Nitrite: NEGATIVE
Protein, ur: 100 mg/dL — AB
Specific Gravity, Urine: 1.041 — ABNORMAL HIGH (ref 1.005–1.030)
pH: 6 (ref 5.0–8.0)

## 2021-02-19 LAB — CBG MONITORING, ED
Glucose-Capillary: 60 mg/dL — ABNORMAL LOW (ref 70–99)
Glucose-Capillary: 67 mg/dL — ABNORMAL LOW (ref 70–99)

## 2021-02-19 LAB — LIPASE, BLOOD: Lipase: <10 U/L — ABNORMAL LOW (ref 11–51)

## 2021-02-19 MED ORDER — SODIUM CHLORIDE 0.9 % IV BOLUS
1000.0000 mL | Freq: Once | INTRAVENOUS | Status: AC
  Administered 2021-02-19: 1000 mL via INTRAVENOUS

## 2021-02-19 MED ORDER — ONDANSETRON HCL 4 MG/2ML IJ SOLN
4.0000 mg | Freq: Once | INTRAMUSCULAR | Status: AC
  Administered 2021-02-19: 4 mg via INTRAVENOUS
  Filled 2021-02-19: qty 2

## 2021-02-19 MED ORDER — DOXYLAMINE-PYRIDOXINE 10-10 MG PO TBEC: 2.0000 | DELAYED_RELEASE_TABLET | Freq: Every evening | ORAL | 0 refills | Status: DC | PRN

## 2021-02-19 NOTE — ED Triage Notes (Signed)
Pt just found out a week ago that she is pregnant.  Has not been to her OB GYN.  Pt has been nauseated and vomiting for the past 4 days.

## 2021-02-19 NOTE — ED Notes (Signed)
Pt provided with gatorade, juice and more crackers. MD notified at this time.

## 2021-02-19 NOTE — Discharge Instructions (Addendum)
Please increase your intake of sugar containing liquids.  Continue your acid medication and nausea medication.  We are also prescribing you some diclegis.  Contact your OB group on Tuesday for close follow-up.  Return to the emergency department for any worsening or concerning symptoms

## 2021-02-19 NOTE — ED Notes (Signed)
PT provided with orange juice and honey graham crackers. MD notified RN of low glucose at 1908.

## 2021-02-20 NOTE — ED Provider Notes (Signed)
[facility] EMERGENCY DEPT Provider Note   CSN: [id number] Arrival date & time: 02/19/21  1609     History Chief Complaint  Patient presents with   Nausea   Emesis    Margaret Goodman is a 25 y.o. female.  She is a G2, P1 with early first trimester pregnancy complaining of nausea and vomiting for the past few days.  She has been using Zofran and PPI without any improvement.  No blood in the vomitus.  No abdominal pain vaginal bleeding or discharge.  She is not seen an OB for this pregnancy.  No sick contacts or recent travel.  No fevers or chills.  The history is provided by the patient.  Emesis Severity:  Moderate Duration:  4 days Timing:  Intermittent Quality:  Bilious material Progression:  Unchanged Chronicity:  New Recent urination:  Decreased Relieved by:  Nothing Worsened by:  Liquids Ineffective treatments:  Antiemetics Associated symptoms: no abdominal pain, no chills, no cough, no diarrhea, no fever, no headaches and no sore throat   Risk factors: pregnant   Risk factors: no sick contacts and no suspect food intake       Past Medical History:  Diagnosis Date   Hyperlipidemia     Patient Active Problem List   Diagnosis Date Noted   Non-reactive NST (non-stress test) 05/18/2018   Vitamin D deficiency 11/19/2017   Rubella non-immune status, antepartum 11/19/2017   Supervision of normal first pregnancy, antepartum 11/06/2017    Past Surgical History:  Procedure Laterality Date   NO PAST SURGERIES       OB History     Gravida  2   Para  1   Term  1   Preterm      AB      Living  1      SAB      IAB      Ectopic      Multiple  0   Live Births  1           History reviewed. No pertinent family history.  Social History   Tobacco Use   Smoking status: Never   Smokeless tobacco: Never  Vaping Use   Vaping Use: Never used  Substance Use Topics   Alcohol use: Yes    Comment: ocassionally   Drug use: No    Home  Medications Prior to Admission medications   Medication Sig Start Date End Date Taking? Authorizing Provider  Doxylamine-Pyridoxine 10-10 MG TBEC Take 2 tablets by mouth at bedtime as needed. 02/19/21  Yes Terrilee Files, MD    Allergies    Tamiflu [oseltamivir phosphate]  Review of Systems   Review of Systems  Constitutional:  Negative for chills and fever.  HENT:  Negative for sore throat.   Eyes:  Negative for visual disturbance.  Respiratory:  Negative for cough and shortness of breath.   Cardiovascular:  Negative for chest pain.  Gastrointestinal:  Positive for nausea and vomiting. Negative for abdominal pain and diarrhea.  Genitourinary:  Negative for dysuria.  Musculoskeletal:  Negative for neck pain.  Skin:  Negative for rash.  Neurological:  Negative for headaches.   Physical Exam Updated Vital Signs BP 120/71   Pulse (!) 58   Temp 98.5 F (36.9 C)   Resp 18   Ht 5\' 8"  (1.727 m)   Wt 95.3 kg   LMP 12/31/2020   SpO2 100%   BMI 31.93 kg/m   Physical Exam Vitals and nursing note  reviewed.  Constitutional:      General: She is not in acute distress.    Appearance: Normal appearance. She is well-developed.  HENT:     Head: Normocephalic and atraumatic.  Eyes:     Conjunctiva/sclera: Conjunctivae normal.  Cardiovascular:     Rate and Rhythm: Normal rate and regular rhythm.     Heart sounds: No murmur heard. Pulmonary:     Effort: Pulmonary effort is normal. No respiratory distress.     Breath sounds: Normal breath sounds.  Abdominal:     Palpations: Abdomen is soft.     Tenderness: There is no abdominal tenderness. There is no guarding or rebound.  Musculoskeletal:        General: Normal range of motion.     Cervical back: Neck supple.     Right lower leg: No edema.     Left lower leg: No edema.  Skin:    General: Skin is warm and dry.  Neurological:     General: No focal deficit present.     Mental Status: She is alert.    ED Results / Procedures  / Treatments   Labs (all labs ordered are listed, but only abnormal results are displayed) Labs Reviewed  LIPASE, BLOOD - Abnormal; Notable for the following components:      Result Value   Lipase <10 (*)    All other components within normal limits  COMPREHENSIVE METABOLIC PANEL - Abnormal; Notable for the following components:   Sodium 134 (*)    CO2 20 (*)    Glucose, Bld 58 (*)    Total Protein 8.2 (*)    Alkaline Phosphatase 37 (*)    All other components within normal limits  URINALYSIS, ROUTINE W REFLEX MICROSCOPIC - Abnormal; Notable for the following components:   Specific Gravity, Urine 1.041 (*)    Ketones, ur >80 (*)    Protein, ur 100 (*)    Bacteria, UA MANY (*)    All other components within normal limits  PREGNANCY, URINE - Abnormal; Notable for the following components:   Preg Test, Ur POSITIVE (*)    All other components within normal limits  CBG MONITORING, ED - Abnormal; Notable for the following components:   Glucose-Capillary 60 (*)    All other components within normal limits  CBG MONITORING, ED - Abnormal; Notable for the following components:   Glucose-Capillary 67 (*)    All other components within normal limits  CBC    EKG None  Radiology No results found.  Procedures Procedures   Medications Ordered in ED Medications  sodium chloride 0.9 % bolus 1,000 mL (0 mLs Intravenous Stopped 02/19/21 2005)  ondansetron (ZOFRAN) injection 4 mg (4 mg Intravenous Given 02/19/21 1908)    ED Course  I have reviewed the triage vital signs and the nursing notes.  Pertinent labs & imaging results that were available during my care of the patient were reviewed by me and considered in my medical decision making (see chart for details).    MDM Rules/Calculators/A&P                         This patient complains of nausea and vomiting in the setting of an early pregnancy; this involves an extensive number of treatment Options and is a complaint that carries  with it a high risk of complications and Morbidity. The differential includes hyperemesis, gastritis, peptic ulcer disease, cholelithiasis, cholecystitis, obstruction, dehydration  I ordered, reviewed and interpreted  labs, which included CBC with normal white count normal hemoglobin, chemistries fairly normal other than low bicarb low glucose reflecting some dehydration and poor glucose reserves, pregnancy test negative, urinalysis concentrated with ketones no obvious infection I ordered medication IV fluids and nausea medication with improvement in her symptoms Previous records obtained and reviewed in epic, no recent presentations  After the interventions stated above, I reevaluated the patient and found patient to be symptomatically improved.  She feels much better after IV fluids.  She has been able to tolerate some orange juice and graham crackers here now.  Blood sugar is slowly improving.  Recommended sugar containing drinks and follow-up with OB.  Given prescription for Diclegis.  Return instructions discussed   Final Clinical Impression(s) / ED Diagnoses Final diagnoses:  Hyperemesis gravidarum  Dehydration  Hypoglycemia    Rx / DC Orders ED Discharge Orders          Ordered    Doxylamine-Pyridoxine 10-10 MG TBEC  At bedtime PRN        02/19/21 2113             Terrilee Files, MD 02/20/21 1004

## 2021-03-07 ENCOUNTER — Inpatient Hospital Stay (HOSPITAL_COMMUNITY)
Admission: AD | Admit: 2021-03-07 | Discharge: 2021-03-07 | Disposition: A | Discharge status: Home / Self Care | Payer: Commercial Managed Care - PPO | Attending: Obstetrics and Gynecology | Admitting: Obstetrics and Gynecology

## 2021-03-07 ENCOUNTER — Encounter (HOSPITAL_COMMUNITY): Payer: Self-pay | Admitting: Obstetrics and Gynecology

## 2021-03-07 ENCOUNTER — Other Ambulatory Visit: Payer: Self-pay

## 2021-03-07 DIAGNOSIS — O99611 Diseases of the digestive system complicating pregnancy, first trimester: Secondary | ICD-10-CM | POA: Insufficient documentation

## 2021-03-07 DIAGNOSIS — K117 Disturbances of salivary secretion: Secondary | ICD-10-CM

## 2021-03-07 DIAGNOSIS — Z3A09 9 weeks gestation of pregnancy: Secondary | ICD-10-CM | POA: Diagnosis not present

## 2021-03-07 DIAGNOSIS — K219 Gastro-esophageal reflux disease without esophagitis: Secondary | ICD-10-CM | POA: Insufficient documentation

## 2021-03-07 DIAGNOSIS — O219 Vomiting of pregnancy, unspecified: Secondary | ICD-10-CM | POA: Diagnosis not present

## 2021-03-07 DIAGNOSIS — O211 Hyperemesis gravidarum with metabolic disturbance: Secondary | ICD-10-CM

## 2021-03-07 LAB — TSH: TSH: 0.015 u[IU]/mL — ABNORMAL LOW (ref 0.350–4.500)

## 2021-03-07 LAB — URINALYSIS, ROUTINE W REFLEX MICROSCOPIC
Bilirubin Urine: NEGATIVE
Glucose, UA: NEGATIVE mg/dL
Hgb urine dipstick: NEGATIVE
Ketones, ur: 80 mg/dL — AB
Leukocytes,Ua: NEGATIVE
Nitrite: NEGATIVE
Protein, ur: 100 mg/dL — AB
Specific Gravity, Urine: 1.032 — ABNORMAL HIGH (ref 1.005–1.030)
pH: 6 (ref 5.0–8.0)

## 2021-03-07 LAB — CBC
HCT: 41.1 % (ref 36.0–46.0)
Hemoglobin: 14.7 g/dL (ref 12.0–15.0)
MCH: 31.8 pg (ref 26.0–34.0)
MCHC: 35.8 g/dL (ref 30.0–36.0)
MCV: 89 fL (ref 80.0–100.0)
Platelets: 209 10*3/uL (ref 150–400)
RBC: 4.62 MIL/uL (ref 3.87–5.11)
RDW: 11.8 % (ref 11.5–15.5)
WBC: 6.2 10*3/uL (ref 4.0–10.5)
nRBC: 0 % (ref 0.0–0.2)

## 2021-03-07 LAB — COMPREHENSIVE METABOLIC PANEL
ALT: 29 U/L (ref 0–44)
AST: 23 U/L (ref 15–41)
Albumin: 4.2 g/dL (ref 3.5–5.0)
Alkaline Phosphatase: 46 U/L (ref 38–126)
Anion gap: 16 — ABNORMAL HIGH (ref 5–15)
BUN: 11 mg/dL (ref 6–20)
CO2: 18 mmol/L — ABNORMAL LOW (ref 22–32)
Calcium: 9.7 mg/dL (ref 8.9–10.3)
Chloride: 100 mmol/L (ref 98–111)
Creatinine, Ser: 0.93 mg/dL (ref 0.44–1.00)
GFR, Estimated: >60 mL/min (ref >60)
Glucose, Bld: 77 mg/dL (ref 70–99)
Potassium: 3.2 mmol/L — ABNORMAL LOW (ref 3.5–5.1)
Sodium: 134 mmol/L — ABNORMAL LOW (ref 135–145)
Total Bilirubin: 1.3 mg/dL — ABNORMAL HIGH (ref 0.3–1.2)
Total Protein: 8.4 g/dL — ABNORMAL HIGH (ref 6.5–8.1)

## 2021-03-07 LAB — T4, FREE: Free T4: 1.4 ng/dL — ABNORMAL HIGH (ref 0.61–1.12)

## 2021-03-07 MED ORDER — ALUM & MAG HYDROXIDE-SIMETH 200-200-20 MG/5ML PO SUSP
30.0000 mL | Freq: Once | ORAL | Status: AC
  Administered 2021-03-07: 30 mL via ORAL
  Filled 2021-03-07: qty 30

## 2021-03-07 MED ORDER — GLYCOPYRROLATE 1 MG PO TABS
2.0000 mg | ORAL_TABLET | Freq: Three times a day (TID) | ORAL | Status: DC
  Administered 2021-03-07: 2 mg via ORAL
  Filled 2021-03-07: qty 2

## 2021-03-07 MED ORDER — M.V.I. ADULT IV INJ
Freq: Once | INTRAVENOUS | Status: AC
  Filled 2021-03-07: qty 10

## 2021-03-07 MED ORDER — ONDANSETRON 4 MG PO TBDP: 4.0000 mg | ORAL_TABLET | Freq: Three times a day (TID) | ORAL | 11 refills | Status: DC | PRN

## 2021-03-07 MED ORDER — POTASSIUM CHLORIDE 20 MEQ PO PACK
40.0000 meq | PACK | Freq: Two times a day (BID) | ORAL | Status: DC
  Administered 2021-03-07: 40 meq via ORAL
  Filled 2021-03-07: qty 2

## 2021-03-07 MED ORDER — SODIUM CHLORIDE 0.9 % IV SOLN
8.0000 mg | Freq: Once | INTRAVENOUS | Status: AC
  Administered 2021-03-07: 8 mg via INTRAVENOUS
  Filled 2021-03-07: qty 4

## 2021-03-07 MED ORDER — LACTATED RINGERS IV SOLN: INTRAVENOUS | Status: DC

## 2021-03-07 MED ORDER — HYOSCYAMINE SULFATE 0.125 MG SL SUBL
0.2500 mg | SUBLINGUAL_TABLET | Freq: Once | SUBLINGUAL | Status: AC
  Administered 2021-03-07: 0.25 mg via SUBLINGUAL
  Filled 2021-03-07: qty 2

## 2021-03-07 MED ORDER — POTASSIUM CHLORIDE 20 MEQ PO PACK: 40.0000 meq | PACK | Freq: Two times a day (BID) | ORAL | 0 refills | Status: DC

## 2021-03-07 MED ORDER — GLYCOPYRROLATE 1 MG PO TABS: 2.0000 mg | ORAL_TABLET | Freq: Three times a day (TID) | ORAL | 4 refills | Status: DC

## 2021-03-07 NOTE — MAU Provider Note (Signed)
History     Chief Complaint  Patient presents with   Emesis   Nausea      25 yo G2P1001 MBF @ 9  5/7 weeks sent from the office due to inability To keep anything down for two weeks. Pt c/o constantly spitting up. Notes  Heartburn. Pt has been taking Diclegis w/o success OB History     Gravida  3   Para  1   Term  1   Preterm      AB  1   Living  1      SAB      IAB  1   Ectopic      Multiple  0   Live Births  1           Past Medical History:  Diagnosis Date   Hyperlipidemia     Past Surgical History:  Procedure Laterality Date   NO PAST SURGERIES      Family History  Problem Relation Age of Onset   Healthy Mother    Healthy Father     Social History   Tobacco Use   Smoking status: Never   Smokeless tobacco: Never  Vaping Use   Vaping Use: Never used  Substance Use Topics   Alcohol use: Not Currently    Comment: ocassionally   Drug use: No    Allergies:  Allergies  Allergen Reactions   Tamiflu [Oseltamivir Phosphate] Hives    Medications Prior to Admission  Medication Sig Dispense Refill Last Dose   Doxylamine-Pyridoxine 10-10 MG TBEC Take 2 tablets by mouth at bedtime as needed. 60 tablet 0 03/05/2021     Physical Exam   Blood pressure 98/70, pulse 99, temperature 97.9 F (36.6 C), temperature source Oral, resp. rate 18, height 5\' 8"  (1.727 m), weight 80.5 kg, last menstrual period 12/31/2020, SpO2 98 %, unknown if currently breastfeeding.  General appearance: alert, cooperative, and no distress Lungs: clear to auscultation bilaterally Heart: regular rate and rhythm, S1, S2 normal, no murmur, click, rub or gallop Abdomen: soft, non-tender; bowel sounds normal; no masses,  no organomegaly Extremities: extremities normal, atraumatic, no cyanosis or edema Skin: Skin color, texture, turgor normal. No rashes or lesions ED Course   HEG IUP @ 9 5/7 week P) 2L IV fluid. Zofran 8mg  IV. MVI 1amp in 2nd bag. CMP, TSH,  Cbc MDM   7/7, MD 1:27 PM 03/07/2021   Addendum CMP Latest Ref Rng & Units 03/07/2021 02/19/2021  Glucose 70 - 99 mg/dL 77 03/09/2021)  BUN 6 - 20 mg/dL 11 11  Creatinine 04/22/2021 - 1.00 mg/dL 54(O 2.70  Sodium 3.50 - 145 mmol/L 134(L) 134(L)  Potassium 3.5 - 5.1 mmol/L 3.2(L) 3.9  Chloride 98 - 111 mmol/L 100 101  CO2 22 - 32 mmol/L 18(L) 20(L)  Calcium 8.9 - 10.3 mg/dL 9.7 9.5  Total Protein 6.5 - 8.1 g/dL 0.93) 8.2(H)  Total Bilirubin 0.3 - 1.2 mg/dL 818) 0.6  Alkaline Phos 38 - 126 U/L 46 37(L)  AST 15 - 41 U/L 23 16  ALT 0 - 44 U/L 29 15    CBC Latest Ref Rng & Units 03/07/2021 02/19/2021 05/18/2018  WBC 4.0 - 10.5 K/uL 6.2 7.1 7.4  Hemoglobin 12.0 - 15.0 g/dL 04/22/2021 05/20/2018 69.6)  Hematocrit 36.0 - 46.0 % 41.1 39.5 30.8(L)  Platelets 150 - 400 K/uL 209 243 182  Labs reviewed' hypokalemia Ptyalism GERD related to vomiting Able to eat crackers and drink P) Kdur 78.9 bid  x 3 days. GI cocktail. Robinul 2mg  po tid D/c home TSH result pending F/u office  Frequent small meals( protein). Oral hydration including liquid IV

## 2021-03-07 NOTE — MAU Note (Signed)
Pt sent to MAU by OB secondary N/V, reports unable to keep anything down.  Denies VB.

## 2021-03-08 LAB — T3: T3, Total: 174 ng/dL (ref 71–180)

## 2021-04-29 ENCOUNTER — Inpatient Hospital Stay (HOSPITAL_COMMUNITY)
Admission: AD | Admit: 2021-04-29 | Discharge: 2021-04-29 | Disposition: A | Discharge status: Home / Self Care | Payer: Commercial Managed Care - PPO | Attending: Obstetrics and Gynecology | Admitting: Obstetrics and Gynecology

## 2021-04-29 ENCOUNTER — Inpatient Hospital Stay (HOSPITAL_BASED_OUTPATIENT_CLINIC_OR_DEPARTMENT_OTHER): Payer: Commercial Managed Care - PPO

## 2021-04-29 ENCOUNTER — Other Ambulatory Visit: Payer: Self-pay

## 2021-04-29 ENCOUNTER — Encounter (HOSPITAL_COMMUNITY): Payer: Self-pay | Admitting: *Deleted

## 2021-04-29 DIAGNOSIS — O219 Vomiting of pregnancy, unspecified: Secondary | ICD-10-CM

## 2021-04-29 DIAGNOSIS — O99282 Endocrine, nutritional and metabolic diseases complicating pregnancy, second trimester: Secondary | ICD-10-CM | POA: Insufficient documentation

## 2021-04-29 DIAGNOSIS — O26892 Other specified pregnancy related conditions, second trimester: Secondary | ICD-10-CM

## 2021-04-29 DIAGNOSIS — Z3A17 17 weeks gestation of pregnancy: Secondary | ICD-10-CM | POA: Diagnosis not present

## 2021-04-29 DIAGNOSIS — R109 Unspecified abdominal pain: Secondary | ICD-10-CM

## 2021-04-29 DIAGNOSIS — O211 Hyperemesis gravidarum with metabolic disturbance: Secondary | ICD-10-CM | POA: Diagnosis not present

## 2021-04-29 DIAGNOSIS — O99612 Diseases of the digestive system complicating pregnancy, second trimester: Secondary | ICD-10-CM | POA: Insufficient documentation

## 2021-04-29 DIAGNOSIS — O26899 Other specified pregnancy related conditions, unspecified trimester: Secondary | ICD-10-CM

## 2021-04-29 DIAGNOSIS — K5900 Constipation, unspecified: Secondary | ICD-10-CM | POA: Diagnosis not present

## 2021-04-29 DIAGNOSIS — R946 Abnormal results of thyroid function studies: Secondary | ICD-10-CM | POA: Insufficient documentation

## 2021-04-29 DIAGNOSIS — E860 Dehydration: Secondary | ICD-10-CM | POA: Diagnosis not present

## 2021-04-29 DIAGNOSIS — Z79899 Other long term (current) drug therapy: Secondary | ICD-10-CM | POA: Diagnosis not present

## 2021-04-29 DIAGNOSIS — E876 Hypokalemia: Secondary | ICD-10-CM

## 2021-04-29 DIAGNOSIS — O210 Mild hyperemesis gravidarum: Secondary | ICD-10-CM

## 2021-04-29 DIAGNOSIS — K117 Disturbances of salivary secretion: Secondary | ICD-10-CM

## 2021-04-29 DIAGNOSIS — R7989 Other specified abnormal findings of blood chemistry: Secondary | ICD-10-CM

## 2021-04-29 DIAGNOSIS — O99611 Diseases of the digestive system complicating pregnancy, first trimester: Secondary | ICD-10-CM

## 2021-04-29 HISTORY — DX: Unspecified abnormal cytological findings in specimens from vagina: R87.629

## 2021-04-29 HISTORY — DX: Urinary tract infection, site not specified: N39.0

## 2021-04-29 HISTORY — DX: Depression, unspecified: F32.A

## 2021-04-29 LAB — COMPREHENSIVE METABOLIC PANEL
ALT: 26 U/L (ref 0–44)
AST: 19 U/L (ref 15–41)
Albumin: 3.2 g/dL — ABNORMAL LOW (ref 3.5–5.0)
Alkaline Phosphatase: 42 U/L (ref 38–126)
Anion gap: 11 (ref 5–15)
BUN: <5 mg/dL — ABNORMAL LOW (ref 6–20)
CO2: 23 mmol/L (ref 22–32)
Calcium: 8.8 mg/dL — ABNORMAL LOW (ref 8.9–10.3)
Chloride: 100 mmol/L (ref 98–111)
Creatinine, Ser: 0.62 mg/dL (ref 0.44–1.00)
GFR, Estimated: >60 mL/min (ref >60)
Glucose, Bld: 66 mg/dL — ABNORMAL LOW (ref 70–99)
Potassium: 3.2 mmol/L — ABNORMAL LOW (ref 3.5–5.1)
Sodium: 134 mmol/L — ABNORMAL LOW (ref 135–145)
Total Bilirubin: 0.9 mg/dL (ref 0.3–1.2)
Total Protein: 6.4 g/dL — ABNORMAL LOW (ref 6.5–8.1)

## 2021-04-29 LAB — CBC
HCT: 34.3 % — ABNORMAL LOW (ref 36.0–46.0)
Hemoglobin: 12 g/dL (ref 12.0–15.0)
MCH: 31.7 pg (ref 26.0–34.0)
MCHC: 35 g/dL (ref 30.0–36.0)
MCV: 90.7 fL (ref 80.0–100.0)
Platelets: 211 10*3/uL (ref 150–400)
RBC: 3.78 MIL/uL — ABNORMAL LOW (ref 3.87–5.11)
RDW: 12.7 % (ref 11.5–15.5)
WBC: 5.8 10*3/uL (ref 4.0–10.5)
nRBC: 0 % (ref 0.0–0.2)

## 2021-04-29 LAB — URINALYSIS, MICROSCOPIC (REFLEX): RBC / HPF: NONE SEEN RBC/hpf (ref 0–5)

## 2021-04-29 LAB — TSH: TSH: <0.01 u[IU]/mL — ABNORMAL LOW (ref 0.350–4.500)

## 2021-04-29 LAB — URINALYSIS, ROUTINE W REFLEX MICROSCOPIC
Glucose, UA: NEGATIVE mg/dL
Hgb urine dipstick: NEGATIVE
Ketones, ur: >80 mg/dL — AB
Leukocytes,Ua: NEGATIVE
Nitrite: NEGATIVE
Protein, ur: 100 mg/dL — AB
Specific Gravity, Urine: 1.025 (ref 1.005–1.030)
pH: 6.5 (ref 5.0–8.0)

## 2021-04-29 LAB — T4, FREE: Free T4: 1.05 ng/dL (ref 0.61–1.12)

## 2021-04-29 LAB — MAGNESIUM: Magnesium: 1.8 mg/dL (ref 1.7–2.4)

## 2021-04-29 MED ORDER — POTASSIUM CHLORIDE 20 MEQ PO PACK: 40.0000 meq | PACK | Freq: Two times a day (BID) | ORAL | 0 refills | Status: DC

## 2021-04-29 MED ORDER — POLYETHYLENE GLYCOL 3350 17 GM/SCOOP PO POWD: 17.0000 g | Freq: Every day | ORAL | 0 refills | Status: DC

## 2021-04-29 MED ORDER — FAMOTIDINE IN NACL 20-0.9 MG/50ML-% IV SOLN
20.0000 mg | Freq: Once | INTRAVENOUS | Status: AC
  Administered 2021-04-29: 20 mg via INTRAVENOUS
  Filled 2021-04-29: qty 50

## 2021-04-29 MED ORDER — SCOPOLAMINE 1 MG/3DAYS TD PT72: 1.0000 | MEDICATED_PATCH | TRANSDERMAL | 12 refills | Status: DC

## 2021-04-29 MED ORDER — SODIUM CHLORIDE 0.9 % IV SOLN
8.0000 mg | Freq: Once | INTRAVENOUS | Status: AC
  Administered 2021-04-29: 8 mg via INTRAVENOUS
  Filled 2021-04-29: qty 4

## 2021-04-29 MED ORDER — LACTATED RINGERS IV BOLUS
1000.0000 mL | Freq: Once | INTRAVENOUS | Status: AC
  Administered 2021-04-29: 1000 mL via INTRAVENOUS

## 2021-04-29 MED ORDER — SCOPOLAMINE 1 MG/3DAYS TD PT72
1.0000 | MEDICATED_PATCH | TRANSDERMAL | Status: DC
  Administered 2021-04-29: 1.5 mg via TRANSDERMAL
  Filled 2021-04-29: qty 1

## 2021-04-29 NOTE — MAU Note (Addendum)
Went to see primary care provider, she noted that she had lost 11# in the last 2 wks.  They were concerned about the babies growth, so sent her here. Hasn't seen OB since June (was confirmation only), having insurance problems.  Is taking meds for nausea. Has not thrown up today. Little pain in lower abd.

## 2021-04-29 NOTE — MAU Provider Note (Signed)
History     CSN: [id number]  Arrival date and time: 04/29/21 1046   Event Date/Time   First Provider Initiated Contact with Patient 04/29/21 1451      Chief Complaint  Patient presents with   Abdominal Pain   Emesis   25 y.o. G3P1011 @17 .0 wks presenting for weight loss. Reports 11 pound loss in the last 2 weeks. Reports daily nausea and only able to tolerate liquids, usually juice and milk. Has emesis about every other day, 1-3 times. Has used Zofran which helps sometimes but not using regularly. Endorses excessive saliva and spitting for which she uses Robinul once a day. Reports her weight was near 200 pounds prior to pregnancy and today was 158. Denies VB. Reports occasional abdominal pain, location varies, describes as tight or sharp, short duration, and resolves spontaneously. Reports no BM in 2 weeks. Has used enemas and suppositories in past. She has not started care d/t insurance but has applied for Medicaid.   OB History     Gravida  3   Para  1   Term  1   Preterm      AB  1   Living  1      SAB      IAB  1   Ectopic      Multiple  0   Live Births  1           Past Medical History:  Diagnosis Date   Depression    dr had conversation today about therapy before medicine   Hyperlipidemia    UTI (urinary tract infection)    Vaginal Pap smear, abnormal    ok since    Past Surgical History:  Procedure Laterality Date   NO PAST SURGERIES      Family History  Problem Relation Age of Onset   Healthy Mother    Healthy Father     Social History   Tobacco Use   Smoking status: Never   Smokeless tobacco: Never  Vaping Use   Vaping Use: Never used  Substance Use Topics   Alcohol use: Not Currently    Comment: ocassionally   Drug use: No    Allergies:  Allergies  Allergen Reactions   Tamiflu [Oseltamivir Phosphate] Hives    Medications Prior to Admission  Medication Sig Dispense Refill Last Dose   Doxylamine-Pyridoxine 10-10 MG  TBEC Take 2 tablets by mouth at bedtime as needed. 60 tablet 0 Past Month   glycopyrrolate (ROBINUL) 1 MG tablet Take 2 tablets (2 mg total) by mouth 3 (three) times daily. 90 tablet 4 Past Month   ondansetron (ZOFRAN ODT) 4 MG disintegrating tablet Take 1 tablet (4 mg total) by mouth every 8 (eight) hours as needed for nausea or vomiting. 20 tablet 11 04/28/2021 at 2230   [DISCONTINUED] potassium chloride (KLOR-CON) 20 MEQ packet Take 40 mEq by mouth 2 (two) times daily for 3 days. 12 packet 0     Review of Systems  Constitutional:  Positive for unexpected weight change. Negative for chills and fever.  Gastrointestinal:  Positive for abdominal pain, constipation, nausea and vomiting.  Genitourinary:  Positive for frequency. Negative for dysuria, urgency, vaginal bleeding and vaginal discharge.  Physical Exam   Blood pressure 107/61, pulse 82, temperature 98.2 F (36.8 C), temperature source Oral, resp. rate 16, height 5\' 7"  (1.702 m), weight 71.9 kg, last menstrual period 12/31/2020, SpO2 100 %, unknown if currently breastfeeding.  Physical Exam Vitals and nursing note reviewed.  Constitutional:  General: She is not in acute distress.    Appearance: Normal appearance.  HENT:     Head: Normocephalic and atraumatic.  Cardiovascular:     Rate and Rhythm: Normal rate.  Pulmonary:     Effort: Pulmonary effort is normal. No respiratory distress.  Abdominal:     General: There is no distension.     Palpations: Abdomen is soft. There is no mass.     Tenderness: There is no abdominal tenderness. There is no guarding or rebound.     Hernia: No hernia is present.     Comments: Fundus 2 below umbilicus  Musculoskeletal:        General: Normal range of motion.     Cervical back: Normal range of motion.  Skin:    General: Skin is warm and dry.  Neurological:     General: No focal deficit present.     Mental Status: She is alert and oriented to person, place, and time.  Psychiatric:         Mood and Affect: Mood normal.        Behavior: Behavior normal.  FHT 136  Results for orders placed or performed during the hospital encounter of 04/29/21 (from the past 24 hour(s))  Urinalysis, Routine w reflex microscopic Urine, Clean Catch     Status: Abnormal   Collection Time: 04/29/21 12:06 PM  Result Value Ref Range   Color, Urine AMBER (A) YELLOW   APPearance CLEAR CLEAR   Specific Gravity, Urine 1.025 1.005 - 1.030   pH 6.5 5.0 - 8.0   Glucose, UA NEGATIVE NEGATIVE mg/dL   Hgb urine dipstick NEGATIVE NEGATIVE   Bilirubin Urine MODERATE (A) NEGATIVE   Ketones, ur >80 (A) NEGATIVE mg/dL   Protein, ur 841 (A) NEGATIVE mg/dL   Nitrite NEGATIVE NEGATIVE   Leukocytes,Ua NEGATIVE NEGATIVE  Urinalysis, Microscopic (reflex)     Status: Abnormal   Collection Time: 04/29/21 12:06 PM  Result Value Ref Range   RBC / HPF NONE SEEN 0 - 5 RBC/hpf   WBC, UA 0-5 0 - 5 WBC/hpf   Bacteria, UA RARE (A) NONE SEEN   Squamous Epithelial / LPF 0-5 0 - 5   Mucus PRESENT    Urine-Other AMMONIUM BIURATE CRYSTALS PRESENT   CBC     Status: Abnormal   Collection Time: 04/29/21  3:05 PM  Result Value Ref Range   WBC 5.8 4.0 - 10.5 K/uL   RBC 3.78 (L) 3.87 - 5.11 MIL/uL   Hemoglobin 12.0 12.0 - 15.0 g/dL   HCT 32.4 (L) 40.1 - 02.7 %   MCV 90.7 80.0 - 100.0 fL   MCH 31.7 26.0 - 34.0 pg   MCHC 35.0 30.0 - 36.0 g/dL   RDW 25.3 66.4 - 40.3 %   Platelets 211 150 - 400 K/uL   nRBC 0.0 0.0 - 0.2 %  Comprehensive metabolic panel     Status: Abnormal   Collection Time: 04/29/21  3:05 PM  Result Value Ref Range   Sodium 134 (L) 135 - 145 mmol/L   Potassium 3.2 (L) 3.5 - 5.1 mmol/L   Chloride 100 98 - 111 mmol/L   CO2 23 22 - 32 mmol/L   Glucose, Bld 66 (L) 70 - 99 mg/dL   BUN <5 (L) 6 - 20 mg/dL   Creatinine, Ser 4.74 0.44 - 1.00 mg/dL   Calcium 8.8 (L) 8.9 - 10.3 mg/dL   Total Protein 6.4 (L) 6.5 - 8.1 g/dL   Albumin 3.2 (L)  3.5 - 5.0 g/dL   AST 19 15 - 41 U/L   ALT 26 0 - 44 U/L   Alkaline  Phosphatase 42 38 - 126 U/L   Total Bilirubin 0.9 0.3 - 1.2 mg/dL   GFR, Estimated >38 >46 mL/min   Anion gap 11 5 - 15  TSH     Status: Abnormal   Collection Time: 04/29/21  3:05 PM  Result Value Ref Range   TSH <0.010 (L) 0.350 - 4.500 uIU/mL   MFM [place]: normal preliminary MAU Course  Procedures LR Zofran Pepcid Scopolamine  MDM Labs ordered and reviewed. Documented weight loss ~19 lbs in less than 2 mos. Low TSH, Free T4 and T3 added, will follow outpt. No emesis while here. Tolerating juice and crackers. Plan to arrange f/u with [facility] to start prenatal care, pt agrees. Stable for discharge home.   Assessment and Plan   1. [redacted] weeks gestation of pregnancy   2. Abdominal pain affecting pregnancy   3. Hyperemesis gravidarum   4. Dehydration   5. Abnormal TSH   6. Hypokalemia   7. Constipation, unspecified constipation type   8. Ptyalism    Discharge home Follow up at [facility] to start care- message sent Rx Scopolamine Rx Miralax Rx Klor-Con Continue Zofran TID Continue Robinul TID  Allergies as of 04/29/2021       Reactions   Tamiflu [oseltamivir Phosphate] Hives        Medication List     STOP taking these medications    Doxylamine-Pyridoxine 10-10 MG Tbec       TAKE these medications    glycopyrrolate 1 MG tablet Commonly known as: ROBINUL Take 2 tablets (2 mg total) by mouth 3 (three) times daily.   ondansetron 4 MG disintegrating tablet Commonly known as: Zofran ODT Take 1 tablet (4 mg total) by mouth every 8 (eight) hours as needed for nausea or vomiting.   polyethylene glycol powder 17 GM/SCOOP powder Commonly known as: GLYCOLAX/MIRALAX Take 17 g by mouth daily.   potassium chloride 20 MEQ packet Commonly known as: KLOR-CON Take 40 mEq by mouth 2 (two) times daily for 3 days.   scopolamine 1 MG/3DAYS Commonly known as: TRANSDERM-SCOP Place 1 patch (1.5 mg total) onto the skin every 3 (three) days. Start taking on: May 02, 2021           Donette Larry, [place] 04/29/2021, 5:49 PM

## 2021-04-29 NOTE — MAU Note (Signed)
Call to dr cousins to verify that Margaret Goodman is a pt of her practice. Pt was seen for verification of pregnancy but pt did not have insurance and is not a pt of dr cousins' practice.

## 2021-05-02 ENCOUNTER — Encounter: Payer: Self-pay | Admitting: Certified Nurse Midwife

## 2021-05-02 ENCOUNTER — Telehealth: Payer: Self-pay | Admitting: Obstetrics and Gynecology

## 2021-05-02 ENCOUNTER — Other Ambulatory Visit: Payer: Self-pay | Admitting: Certified Nurse Midwife

## 2021-05-02 DIAGNOSIS — O99282 Endocrine, nutritional and metabolic diseases complicating pregnancy, second trimester: Secondary | ICD-10-CM

## 2021-05-02 DIAGNOSIS — E0590 Thyrotoxicosis, unspecified without thyrotoxic crisis or storm: Secondary | ICD-10-CM

## 2021-05-02 LAB — T3: T3, Total: 183 ng/dL — ABNORMAL HIGH (ref 71–180)

## 2021-05-02 MED ORDER — METHIMAZOLE 5 MG PO TABS: 5.0000 mg | ORAL_TABLET | Freq: Every day | ORAL | 1 refills | Status: DC

## 2021-05-04 ENCOUNTER — Telehealth: Payer: Self-pay

## 2021-05-04 NOTE — Telephone Encounter (Signed)
Call and spoke with pt. Pt states insurance wont cover her to go back to [facility] at this time and cant be seen at private office. Pt will not have maternity coverage until Jan 2023. Pt advised to come here for care. New OB appt scheduled for 9/15 at 1055. Pt agreeable to date and time of appt.   Judeth Cornfield, RN

## 2021-05-04 NOTE — Telephone Encounter (Signed)
Patient left VM on nurse line stating she is returning a call from a nurse. Would like to know why her appt was cancelled.

## 2021-05-05 ENCOUNTER — Other Ambulatory Visit: Payer: Self-pay

## 2021-05-05 ENCOUNTER — Ambulatory Visit (INDEPENDENT_AMBULATORY_CARE_PROVIDER_SITE_OTHER): Payer: Commercial Managed Care - PPO | Admitting: Obstetrics and Gynecology

## 2021-05-05 ENCOUNTER — Other Ambulatory Visit (HOSPITAL_COMMUNITY)
Admission: RE | Admit: 2021-05-05 | Discharge: 2021-05-05 | Disposition: A | Discharge status: Home / Self Care | Payer: Commercial Managed Care - PPO | Source: Ambulatory Visit | Attending: Obstetrics and Gynecology | Admitting: Obstetrics and Gynecology

## 2021-05-05 VITALS — BP 95/62 | HR 90 | Wt 155.2 lb

## 2021-05-05 DIAGNOSIS — Z3480 Encounter for supervision of other normal pregnancy, unspecified trimester: Secondary | ICD-10-CM | POA: Insufficient documentation

## 2021-05-05 DIAGNOSIS — O219 Vomiting of pregnancy, unspecified: Secondary | ICD-10-CM | POA: Diagnosis not present

## 2021-05-05 DIAGNOSIS — Z3A17 17 weeks gestation of pregnancy: Secondary | ICD-10-CM | POA: Diagnosis not present

## 2021-05-05 DIAGNOSIS — E0590 Thyrotoxicosis, unspecified without thyrotoxic crisis or storm: Secondary | ICD-10-CM

## 2021-05-05 DIAGNOSIS — O99282 Endocrine, nutritional and metabolic diseases complicating pregnancy, second trimester: Secondary | ICD-10-CM | POA: Diagnosis not present

## 2021-05-05 DIAGNOSIS — O99280 Endocrine, nutritional and metabolic diseases complicating pregnancy, unspecified trimester: Secondary | ICD-10-CM

## 2021-05-05 MED ORDER — PROMETHAZINE HCL 25 MG RE SUPP: 25.0000 mg | Freq: Four times a day (QID) | RECTAL | 0 refills | Status: DC | PRN

## 2021-05-05 MED ORDER — BLOOD PRESSURE KIT DEVI: 1.0000 | 0 refills | Status: DC

## 2021-05-05 NOTE — Progress Notes (Signed)
INITIAL PRENATAL VISIT NOTE  Subjective:  Margaret Goodman is a 25 y.o. G3P1011 at 83w6dby LMP being seen today for her initial prenatal visit.  She has an obstetric history significant for SVD. She has a medical history significant for recently diagnosed hyperthyroidism.  Pt states she has lost 50 pounds since June due to nausea.  Unsure if nausea and weight loss is from hyperthyroid or hyperemesis.  Patient reports nausea.  Contractions: Not present. Vag. Bleeding: None.  Movement: Present. Denies leaking of fluid.    Past Medical History:  Diagnosis Date   Depression    dr had conversation today about therapy before medicine   Hyperlipidemia    UTI (urinary tract infection)    Vaginal Pap smear, abnormal    ok since    Past Surgical History:  Procedure Laterality Date   NO PAST SURGERIES      OB History  Gravida Para Term Preterm AB Living  3 1 1   1 1   SAB IAB Ectopic Multiple Live Births    1   0 1    # Outcome Date GA Lbr Len/2nd Weight Sex Delivery Anes PTL Lv  3 Current           2 Term 05/19/18 [id number] / 01:53 7 lb 5.1 oz (3.32 kg) M Vag-Spont EPI  LIV  1 IAB             Social History   Socioeconomic History   Marital status: Single    Spouse name: Not on file   Number of children: Not on file   Years of education: Not on file   Highest education level: Not on file  Occupational History   Not on file  Tobacco Use   Smoking status: Never   Smokeless tobacco: Never  Vaping Use   Vaping Use: Never used  Substance and Sexual Activity   Alcohol use: Not Currently    Comment: ocassionally   Drug use: No   Sexual activity: Yes    Birth control/protection: None  Other Topics Concern   Not on file  Social History Narrative   Not on file   Social Determinants of Health   Financial Resource Strain: Not on file  Food Insecurity: Not on file  Transportation Needs: Not on file  Physical Activity: Not on file  Stress: Not on file  Social  Connections: Not on file    Family History  Problem Relation Age of Onset   Healthy Mother    Healthy Father      Current Outpatient Medications:    glycopyrrolate (ROBINUL) 1 MG tablet, Take 2 tablets (2 mg total) by mouth 3 (three) times daily., Disp: 90 tablet, Rfl: 4   methimazole (TAPAZOLE) 5 MG tablet, Take 1 tablet (5 mg total) by mouth daily., Disp: 30 tablet, Rfl: 1   ondansetron (ZOFRAN ODT) 4 MG disintegrating tablet, Take 1 tablet (4 mg total) by mouth every 8 (eight) hours as needed for nausea or vomiting., Disp: 20 tablet, Rfl: 11   polyethylene glycol powder (GLYCOLAX/MIRALAX) 17 GM/SCOOP powder, Take 17 g by mouth daily., Disp: 255 g, Rfl: 0   promethazine (PHENERGAN) 25 MG suppository, Place 1 suppository (25 mg total) rectally every 6 (six) hours as needed for nausea or vomiting., Disp: 12 each, Rfl: 0   scopolamine (TRANSDERM-SCOP) 1 MG/3DAYS, Place 1 patch (1.5 mg total) onto the skin every 3 (three) days., Disp: 10 patch, Rfl: 12   amoxicillin (AMOXIL) 875 MG tablet,  Take 875 mg by mouth 2 (two) times daily., Disp: , Rfl:    Blood Pressure Monitoring (BLOOD PRESSURE KIT) DEVI, 1 each by Does not apply route once a week., Disp: 1 each, Rfl: 0   potassium chloride (KLOR-CON) 20 MEQ packet, Take 40 mEq by mouth 2 (two) times daily for 3 days., Disp: 12 packet, Rfl: 0  Allergies  Allergen Reactions   Tamiflu [Oseltamivir Phosphate] Hives    Review of Systems: Negative except for what is mentioned in HPI.  Objective:   Vitals:   05/05/21 1133  BP: 95/62  Pulse: 90  Weight: 155 lb 3.2 oz (70.4 kg)    Fetal Status: Fetal Heart Rate (bpm): 141 Fundal Height: 17 cm Movement: Present     Physical Exam: BP 95/62   Pulse 90   Wt 155 lb 3.2 oz (70.4 kg)   LMP 12/31/2020   BMI 24.31 kg/m  CONSTITUTIONAL: Well-developed, well-nourished female in no acute distress. Pt does look slightly ill in appearance NEUROLOGIC: Alert and oriented to person, place, and time.  Normal reflexes, muscle tone coordination. No cranial nerve deficit noted. PSYCHIATRIC: Normal mood and affect. Normal behavior. Normal judgment and thought content. SKIN: Skin is warm and dry. No rash noted. Not diaphoretic. No erythema. No pallor. HENT:  Normocephalic, atraumatic, External right and left ear normal. Oropharynx is clear and moist EYES: Conjunctivae and EOM are normal. Pupils are equal, round, and reactive to light. No scleral icterus.  NECK: Normal range of motion, supple, no masses CARDIOVASCULAR: Normal heart rate noted, regular rhythm RESPIRATORY: Effort and breath sounds normal, no problems with respiration noted BREASTS: deferred ABDOMEN: Soft, nontender, nondistended, gravid.  Fundal height at 17 cm GU: deferred MUSCULOSKELETAL: Normal range of motion. EXT:  No edema and no tenderness. 2+ distal pulses.   Assessment and Plan:  Pregnancy: G3P1011 at 55w6dby LMP  1. Supervision of other normal pregnancy, antepartum Continue routine care Anatomy scan ordered - CBC/D/Plt+RPR+Rh+ABO+RubIgG... - Hemoglobin A1c - Culture, OB Urine - Blood Pressure Monitoring (BLOOD PRESSURE KIT) DEVI; 1 each by Does not apply route once a week.  Dispense: 1 each; Refill: 0 - Cervicovaginal ancillary only( [place]) [place] [facility] [place]; Future  2. [redacted] weeks gestation of pregnancy   3. Hyperthyroidism in pregnancy, antepartum Pt recently diagnosed with hyperthyroid, recheck labs at next visit, pt just started methimazole 2 days ago, no adjustments made - Ambulatory referral [place]inology [place] [facility] [place]  4. Nausea and vomiting during pregnancy Pt advised to monitor intake closely for continued weight loss,  - promethazine (PHENERGAN) 25 MG suppository; Place 1 suppository (25 mg total) rectally every 6 (six) hours as needed for nausea or vomiting.  Dispense: 12 each; Refill: 0   Preterm labor symptoms and general obstetric precautions including but  not limited to vaginal bleeding, contractions, leaking of fluid and fetal movement were reviewed in detail with the patient.  Please refer to After Visit Summary for other counseling recommendations.   Return in about 3 weeks (around 05/26/2021) for ROB, in person.  LGriffin Basil9/15/2022 12:12 PM

## 2021-05-05 NOTE — Progress Notes (Deleted)
INITIAL PRENATAL VISIT NOTE  Subjective:  Margaret Goodman is a 25 y.o. G3P1011 at [redacted]w[redacted]d by LMP being seen today for her initial prenatal visit.  She has an obstetric history significant for SVD. She has a medical history significant for recently diagnosed hyperthyroid.  Patient reports nausea.  Contractions: Not present. Vag. Bleeding: None.  Movement: Present. Denies leaking of fluid.    Past Medical History:  Diagnosis Date   Depression    dr had conversation today about therapy before medicine   Hyperlipidemia    UTI (urinary tract infection)    Vaginal Pap smear, abnormal    ok since    Past Surgical History:  Procedure Laterality Date   NO PAST SURGERIES      OB History  Gravida Para Term Preterm AB Living  3 1 1   1 1   SAB IAB Ectopic Multiple Live Births    1   0 1    # Outcome Date GA Lbr Len/2nd Weight Sex Delivery Anes PTL Lv  3 Current           2 Term 05/19/18 [redacted]w[redacted]d 18:36 / 01:53 7 lb 5.1 oz (3.32 kg) M Vag-Spont EPI  LIV  1 IAB             Social History   Socioeconomic History   Marital status: Single    Spouse name: Not on file   Number of children: Not on file   Years of education: Not on file   Highest education level: Not on file  Occupational History   Not on file  Tobacco Use   Smoking status: Never   Smokeless tobacco: Never  Vaping Use   Vaping Use: Never used  Substance and Sexual Activity   Alcohol use: Not Currently    Comment: ocassionally   Drug use: No   Sexual activity: Yes    Birth control/protection: None  Other Topics Concern   Not on file  Social History Narrative   Not on file   Social Determinants of Health   Financial Resource Strain: Not on file  Food Insecurity: Not on file  Transportation Needs: Not on file  Physical Activity: Not on file  Stress: Not on file  Social Connections: Not on file    Family History  Problem Relation Age of Onset   Healthy Mother    Healthy Father      Current Outpatient  Medications:    glycopyrrolate (ROBINUL) 1 MG tablet, Take 2 tablets (2 mg total) by mouth 3 (three) times daily., Disp: 90 tablet, Rfl: 4   methimazole (TAPAZOLE) 5 MG tablet, Take 1 tablet (5 mg total) by mouth daily., Disp: 30 tablet, Rfl: 1   ondansetron (ZOFRAN ODT) 4 MG disintegrating tablet, Take 1 tablet (4 mg total) by mouth every 8 (eight) hours as needed for nausea or vomiting., Disp: 20 tablet, Rfl: 11   polyethylene glycol powder (GLYCOLAX/MIRALAX) 17 GM/SCOOP powder, Take 17 g by mouth daily., Disp: 255 g, Rfl: 0   scopolamine (TRANSDERM-SCOP) 1 MG/3DAYS, Place 1 patch (1.5 mg total) onto the skin every 3 (three) days., Disp: 10 patch, Rfl: 12   amoxicillin (AMOXIL) 875 MG tablet, Take 875 mg by mouth 2 (two) times daily., Disp: , Rfl:    potassium chloride (KLOR-CON) 20 MEQ packet, Take 40 mEq by mouth 2 (two) times daily for 3 days., Disp: 12 packet, Rfl: 0  Allergies  Allergen Reactions   Tamiflu [Oseltamivir Phosphate] Hives    Review of  Systems: Negative except for what is mentioned in HPI.  Objective:   Vitals:   05/05/21 1133  BP: 95/62  Pulse: 90  Weight: 155 lb 3.2 oz (70.4 kg)    Fetal Status: Fetal Heart Rate (bpm): 141   Movement: Present     Physical Exam: BP 95/62   Pulse 90   Wt 155 lb 3.2 oz (70.4 kg)   LMP 12/31/2020   BMI 24.31 kg/m  CONSTITUTIONAL: Well-developed, well-nourished female in ***no acute distress.  NEUROLOGIC: Alert and oriented to person, place, and time. Normal reflexes, muscle tone coordination. No cranial nerve deficit noted. PSYCHIATRIC: Normal mood and affect. Normal behavior. Normal judgment and thought content. SKIN: Skin is warm and dry. No rash noted. Not diaphoretic. No erythema. No pallor. HENT:  Normocephalic, atraumatic, External right and left ear normal. Oropharynx is clear and moist EYES: Conjunctivae and EOM are normal. Pupils are equal, round, and reactive to light. No scleral icterus.  NECK: Normal range of  motion, supple, no masses CARDIOVASCULAR: Normal heart rate noted, regular rhythm RESPIRATORY: Effort and breath sounds normal, no problems with respiration noted BREASTS: symmetric, ***non-tender, no masses palpable ABDOMEN: Soft, ***nontender, ***nondistended, gravid. GU: ***normal appearing external female genitalia, ***multiparous***nulliparous ***normal appearing cervix, scant white discharge in vagina, no lesions noted Bimanual: *** weeks sized uterus, no adnexal tenderness or palpable lesions noted MUSCULOSKELETAL: Normal range of motion. EXT:  No edema and no tenderness. 2+ distal pulses.   Assessment and Plan:  Pregnancy: G3P1011 at [redacted]w[redacted]d by ***LMP  1. Supervision of other normal pregnancy, antepartum *** - [place] [facility] OB COMP + 14 WK; Future  2. [redacted] weeks gestation of pregnancy ***  3. Hyperthyroidism in pregnancy, antepartum ***   {Blank single:19197::"Term","Preterm"} labor symptoms and general obstetric precautions including but not limited to vaginal bleeding, contractions, leaking of fluid and fetal movement were reviewed in detail with the patient.  Please refer to After Visit Summary for other counseling recommendations.   Return in about 4 weeks (around 06/02/2021) for ROB, in person.  Warden Fillers 05/05/2021 11:55 AM

## 2021-05-05 NOTE — Progress Notes (Signed)
Lower back pain along with lower abdominal pain

## 2021-05-06 LAB — CBC/D/PLT+RPR+RH+ABO+RUBIGG...
Antibody Screen: NEGATIVE
Basophils Absolute: 0 10*3/uL (ref 0.0–0.2)
Basos: 1 %
EOS (ABSOLUTE): 0 10*3/uL (ref 0.0–0.4)
Eos: 1 %
HCV Ab: <0.1 s/co ratio (ref 0.0–0.9)
HIV Screen 4th Generation wRfx: NONREACTIVE
Hematocrit: 36.1 % (ref 34.0–46.6)
Hemoglobin: 12.2 g/dL (ref 11.1–15.9)
Hepatitis B Surface Ag: NEGATIVE
Immature Grans (Abs): 0 10*3/uL (ref 0.0–0.1)
Immature Granulocytes: 0 %
Lymphocytes Absolute: 1.3 10*3/uL (ref 0.7–3.1)
Lymphs: 30 %
MCH: 31.1 pg (ref 26.6–33.0)
MCHC: 33.8 g/dL (ref 31.5–35.7)
MCV: 92 fL (ref 79–97)
Monocytes Absolute: 0.5 10*3/uL (ref 0.1–0.9)
Monocytes: 11 %
Neutrophils Absolute: 2.5 10*3/uL (ref 1.4–7.0)
Neutrophils: 57 %
Platelets: 206 10*3/uL (ref 150–450)
RBC: 3.92 x10E6/uL (ref 3.77–5.28)
RDW: 13.8 % (ref 11.7–15.4)
RPR Ser Ql: NONREACTIVE
Rh Factor: POSITIVE
Rubella Antibodies, IGG: 2.59 index (ref >0.99)
WBC: 4.4 10*3/uL (ref 3.4–10.8)

## 2021-05-06 LAB — HEMOGLOBIN A1C
Est. average glucose Bld gHb Est-mCnc: 94 mg/dL
Hgb A1c MFr Bld: 4.9 % (ref 4.8–5.6)

## 2021-05-06 LAB — HCV INTERPRETATION

## 2021-05-06 LAB — CERVICOVAGINAL ANCILLARY ONLY
Chlamydia: NEGATIVE
Comment: NEGATIVE
Comment: NORMAL
Neisseria Gonorrhea: NEGATIVE

## 2021-05-07 LAB — CULTURE, OB URINE

## 2021-05-07 LAB — URINE CULTURE, OB REFLEX

## 2021-05-10 ENCOUNTER — Other Ambulatory Visit: Payer: Self-pay

## 2021-05-10 ENCOUNTER — Encounter (HOSPITAL_COMMUNITY): Payer: Self-pay

## 2021-05-10 ENCOUNTER — Ambulatory Visit (HOSPITAL_COMMUNITY)
Admission: EM | Admit: 2021-05-10 | Discharge: 2021-05-10 | Disposition: A | Discharge status: Home / Self Care | Payer: Commercial Managed Care - PPO | Attending: Internal Medicine | Admitting: Internal Medicine

## 2021-05-10 DIAGNOSIS — E860 Dehydration: Secondary | ICD-10-CM | POA: Insufficient documentation

## 2021-05-10 DIAGNOSIS — Z3A17 17 weeks gestation of pregnancy: Secondary | ICD-10-CM | POA: Diagnosis not present

## 2021-05-10 DIAGNOSIS — E861 Hypovolemia: Secondary | ICD-10-CM | POA: Diagnosis not present

## 2021-05-10 DIAGNOSIS — O99282 Endocrine, nutritional and metabolic diseases complicating pregnancy, second trimester: Secondary | ICD-10-CM

## 2021-05-10 HISTORY — DX: Hypotension, unspecified: I95.9

## 2021-05-10 LAB — BASIC METABOLIC PANEL
Anion gap: 11 (ref 5–15)
BUN: 5 mg/dL — ABNORMAL LOW (ref 6–20)
CO2: 22 mmol/L (ref 22–32)
Calcium: 9.2 mg/dL (ref 8.9–10.3)
Chloride: 98 mmol/L (ref 98–111)
Creatinine, Ser: 0.69 mg/dL (ref 0.44–1.00)
GFR, Estimated: >60 mL/min (ref >60)
Glucose, Bld: 84 mg/dL (ref 70–99)
Potassium: 3 mmol/L — ABNORMAL LOW (ref 3.5–5.1)
Sodium: 131 mmol/L — ABNORMAL LOW (ref 135–145)

## 2021-05-10 MED ORDER — SODIUM CHLORIDE 0.9 % IV BOLUS
1000.0000 mL | Freq: Once | INTRAVENOUS | Status: AC
  Administered 2021-05-10: 1000 mL via INTRAVENOUS

## 2021-05-10 NOTE — ED Provider Notes (Signed)
[facility]    CSN: [id number] Arrival date & time: 05/10/21  1019      History   Chief Complaint Chief Complaint  Patient presents with   Hypotension    HPI Corrie Reder is a 25 y.o. female currently [redacted] weeks pregnant is seen in the urgent care for generalized weakness, poor oral intake and vomiting.  Patient was seen by obstetrician couple of weeks ago and changes were made to her medications related to hyperemesis gravidarum.  Patient continues to have challenges with maintaining oral fluid intake.  She denies any abdominal pain or bleeding.  She has some dizziness on ambulation.  She denies any syncope or near syncopal episodes. HPI  Past Medical History:  Diagnosis Date   Depression    dr had conversation today about therapy before medicine   Hyperlipidemia    Hypotension    UTI (urinary tract infection)    Vaginal Pap smear, abnormal    ok since    Patient Active Problem List   Diagnosis Date Noted   Supervision of other normal pregnancy, antepartum 05/05/2021   [redacted] weeks gestation of pregnancy 05/05/2021   Nausea and vomiting during pregnancy 05/05/2021   Hyperthyroidism in pregnancy, antepartum 05/02/2021   Non-reactive NST (non-stress test) 05/18/2018   Vitamin D deficiency 11/19/2017   Rubella non-immune status, antepartum 11/19/2017    Past Surgical History:  Procedure Laterality Date   NO PAST SURGERIES      OB History     Gravida  3   Para  1   Term  1   Preterm      AB  1   Living  1      SAB      IAB  1   Ectopic      Multiple  0   Live Births  1            Home Medications    Prior to Admission medications   Medication Sig Start Date End Date Taking? Authorizing Provider  amoxicillin (AMOXIL) 875 MG tablet Take 875 mg by mouth 2 (two) times daily. 04/08/21   [provider]  Blood Pressure Monitoring (BLOOD PRESSURE KIT) DEVI 1 each by Does not apply route once a week. 05/05/21   Newell Coral, MD   glycopyrrolate (ROBINUL) 1 MG tablet Take 2 tablets (2 mg total) by mouth 3 (three) times daily. 03/07/21   Servando Salina, MD  methimazole (TAPAZOLE) 5 MG tablet Take 1 tablet (5 mg total) by mouth daily. 05/02/21   Julianne Handler, CNM  ondansetron (ZOFRAN ODT) 4 MG disintegrating tablet Take 1 tablet (4 mg total) by mouth every 8 (eight) hours as needed for nausea or vomiting. 03/07/21   Servando Salina, MD  polyethylene glycol powder (GLYCOLAX/MIRALAX) 17 GM/SCOOP powder Take 17 g by mouth daily. 04/29/21   Julianne Handler, CNM  potassium chloride (KLOR-CON) 20 MEQ packet Take 40 mEq by mouth 2 (two) times daily for 3 days. 04/29/21 05/02/21  Julianne Handler, CNM  promethazine (PHENERGAN) 25 MG suppository Place 1 suppository (25 mg total) rectally every 6 (six) hours as needed for nausea or vomiting. 05/05/21   Iass, Carolann Littler, MD  scopolamine (TRANSDERM-SCOP) 1 MG/3DAYS Place 1 patch (1.5 mg total) onto the skin every 3 (three) days. 05/02/21   Julianne Handler, CNM    Family History Family History  Problem Relation Age of Onset   Healthy Mother    Healthy Father     Social History Social  History   Tobacco Use   Smoking status: Never   Smokeless tobacco: Never  Vaping Use   Vaping Use: Never used  Substance Use Topics   Alcohol use: Not Currently    Comment: ocassionally   Drug use: No     Allergies   Tamiflu [oseltamivir phosphate]   Review of Systems Review of Systems As per HPI  Physical Exam Triage Vital Signs ED Triage Vitals  Enc Vitals Group     BP 05/10/21 1026 90/64     Pulse Rate 05/10/21 1026 98     Resp 05/10/21 1026 16     Temp --      Temp src --      SpO2 05/10/21 1026 100 %     Weight --      Height --      Head Circumference --      Peak Flow --      Pain Score 05/10/21 1024 0     Pain Loc --      Pain Edu? --      Excl. in [place]? --    No data found.  Updated Vital Signs BP 90/64 (BP Location: Right Arm)   Pulse 98   Resp 16    LMP 12/31/2020   SpO2 100%   Visual Acuity Right Eye Distance:   Left Eye Distance:   Bilateral Distance:    Right Eye Near:   Left Eye Near:    Bilateral Near:     Physical Exam Vitals and nursing note reviewed.  Constitutional:      General: She is not in acute distress.    Appearance: She is not ill-appearing.  Cardiovascular:     Rate and Rhythm: Normal rate and regular rhythm.     Pulses: Normal pulses.     Heart sounds: Normal heart sounds.  Pulmonary:     Effort: Pulmonary effort is normal.     Breath sounds: Normal breath sounds.  Abdominal:     General: Bowel sounds are normal.     Palpations: Abdomen is soft.  Neurological:     Mental Status: She is alert.     UC Treatments / Results  Labs (all labs ordered are listed, but only abnormal results are displayed) Labs Reviewed  BASIC METABOLIC PANEL    EKG   Radiology No results found.  Procedures Procedures (including critical care time)  Medications Ordered in UC Medications  sodium chloride 0.9 % bolus 1,000 mL (1,000 mLs Intravenous New Bag/Given 05/10/21 1111)    Initial Impression / Assessment and Plan / UC Course  I have reviewed the triage vital signs and the nursing notes.  Pertinent labs & imaging results that were available during my care of the patient were reviewed by me and considered in my medical decision making (see chart for details).     1.  Hypovolemia secondary to poor oral intake: 1 L normal saline fluid bolus BMP. VItamin D level given patient has a history of vitamin D deficiency If patient's symptoms worsens, I will advise her to take the rest of the day off from work Increase oral fluid intake Patient agrees to the plan of care. Final Clinical Impressions(s) / UC Diagnoses   Final diagnoses:  Hypovolemia due to dehydration     Discharge Instructions      Increase oral fluid intake We will discuss labs if abnormal As we discussed, if you do not feel better I  will recommend you take the rest  of the day off    ED Prescriptions   None    PDMP not reviewed this encounter.   Chase Picket, MD 05/10/21 1213

## 2021-05-10 NOTE — ED Triage Notes (Signed)
Pt is here with hypotension that started a while back(months) she states. Pt noticed this morning that it was followed with dizziness. Pt has not taken any meds to relieve discomfort.

## 2021-05-10 NOTE — Discharge Instructions (Signed)
Increase oral fluid intake We will discuss labs if abnormal As we discussed, if you do not feel better I will recommend you take the rest of the day off

## 2021-05-15 ENCOUNTER — Other Ambulatory Visit: Payer: Self-pay

## 2021-05-15 ENCOUNTER — Inpatient Hospital Stay (HOSPITAL_COMMUNITY)
Admission: AD | Admit: 2021-05-15 | Discharge: 2021-05-15 | Disposition: A | Discharge status: Home / Self Care | Payer: Commercial Managed Care - PPO | Attending: Obstetrics and Gynecology | Admitting: Obstetrics and Gynecology

## 2021-05-15 ENCOUNTER — Encounter (HOSPITAL_COMMUNITY): Payer: Self-pay | Admitting: Obstetrics and Gynecology

## 2021-05-15 DIAGNOSIS — O210 Mild hyperemesis gravidarum: Secondary | ICD-10-CM | POA: Diagnosis not present

## 2021-05-15 DIAGNOSIS — O219 Vomiting of pregnancy, unspecified: Secondary | ICD-10-CM | POA: Diagnosis not present

## 2021-05-15 DIAGNOSIS — Z3A19 19 weeks gestation of pregnancy: Secondary | ICD-10-CM | POA: Diagnosis not present

## 2021-05-15 LAB — URINALYSIS, ROUTINE W REFLEX MICROSCOPIC
Bilirubin Urine: NEGATIVE
Glucose, UA: NEGATIVE mg/dL
Hgb urine dipstick: NEGATIVE
Ketones, ur: 80 mg/dL — AB
Leukocytes,Ua: NEGATIVE
Nitrite: NEGATIVE
Protein, ur: 100 mg/dL — AB
Specific Gravity, Urine: 1.027 (ref 1.005–1.030)
pH: 5 (ref 5.0–8.0)

## 2021-05-15 LAB — COMPREHENSIVE METABOLIC PANEL
ALT: 22 U/L (ref 0–44)
AST: 17 U/L (ref 15–41)
Albumin: 2.8 g/dL — ABNORMAL LOW (ref 3.5–5.0)
Alkaline Phosphatase: 41 U/L (ref 38–126)
Anion gap: 11 (ref 5–15)
BUN: 5 mg/dL — ABNORMAL LOW (ref 6–20)
CO2: 20 mmol/L — ABNORMAL LOW (ref 22–32)
Calcium: 8.8 mg/dL — ABNORMAL LOW (ref 8.9–10.3)
Chloride: 102 mmol/L (ref 98–111)
Creatinine, Ser: 0.74 mg/dL (ref 0.44–1.00)
GFR, Estimated: >60 mL/min (ref >60)
Glucose, Bld: 60 mg/dL — ABNORMAL LOW (ref 70–99)
Potassium: 3.6 mmol/L (ref 3.5–5.1)
Sodium: 133 mmol/L — ABNORMAL LOW (ref 135–145)
Total Bilirubin: 0.9 mg/dL (ref 0.3–1.2)
Total Protein: 5.9 g/dL — ABNORMAL LOW (ref 6.5–8.1)

## 2021-05-15 LAB — CBC
HCT: 32.5 % — ABNORMAL LOW (ref 36.0–46.0)
Hemoglobin: 11.5 g/dL — ABNORMAL LOW (ref 12.0–15.0)
MCH: 32.6 pg (ref 26.0–34.0)
MCHC: 35.4 g/dL (ref 30.0–36.0)
MCV: 92.1 fL (ref 80.0–100.0)
Platelets: 192 10*3/uL (ref 150–400)
RBC: 3.53 MIL/uL — ABNORMAL LOW (ref 3.87–5.11)
RDW: 13.7 % (ref 11.5–15.5)
WBC: 5.6 10*3/uL (ref 4.0–10.5)
nRBC: 0 % (ref 0.0–0.2)

## 2021-05-15 MED ORDER — FAMOTIDINE IN NACL 20-0.9 MG/50ML-% IV SOLN
20.0000 mg | Freq: Once | INTRAVENOUS | Status: DC
  Filled 2021-05-15: qty 50

## 2021-05-15 MED ORDER — FAMOTIDINE 20 MG IN NS 100 ML IVPB
20.0000 mg | Freq: Once | INTRAVENOUS | Status: AC
  Administered 2021-05-15: 20 mg via INTRAVENOUS
  Filled 2021-05-15: qty 100

## 2021-05-15 MED ORDER — PROMETHAZINE HCL 25 MG RE SUPP: 25.0000 mg | Freq: Four times a day (QID) | RECTAL | 11 refills | Status: DC | PRN

## 2021-05-15 MED ORDER — SODIUM CHLORIDE 0.9 % IV SOLN
25.0000 mg | Freq: Once | INTRAVENOUS | Status: AC
  Administered 2021-05-15: 25 mg via INTRAVENOUS
  Filled 2021-05-15: qty 1

## 2021-05-15 MED ORDER — M.V.I. ADULT IV INJ
Freq: Once | INTRAVENOUS | Status: AC
  Filled 2021-05-15: qty 10

## 2021-05-15 MED ORDER — LACTATED RINGERS IV BOLUS
1000.0000 mL | Freq: Once | INTRAVENOUS | Status: AC
  Administered 2021-05-15: 1000 mL via INTRAVENOUS

## 2021-05-15 MED ORDER — PROCHLORPERAZINE MALEATE 10 MG PO TABS: 10.0000 mg | ORAL_TABLET | Freq: Two times a day (BID) | ORAL | 3 refills | Status: DC | PRN

## 2021-05-15 MED ORDER — METOCLOPRAMIDE HCL 10 MG PO TABS: 10.0000 mg | ORAL_TABLET | Freq: Four times a day (QID) | ORAL | 3 refills | Status: DC

## 2021-05-15 MED ORDER — PROMETHAZINE HCL 25 MG PO TABS: 25.0000 mg | ORAL_TABLET | Freq: Four times a day (QID) | ORAL | 3 refills | Status: DC | PRN

## 2021-05-15 MED ORDER — PROCHLORPERAZINE EDISYLATE 10 MG/2ML IJ SOLN
10.0000 mg | Freq: Once | INTRAMUSCULAR | Status: AC
  Administered 2021-05-15: 10 mg via INTRAVENOUS
  Filled 2021-05-15: qty 2

## 2021-05-15 NOTE — Discharge Instructions (Signed)

## 2021-05-15 NOTE — MAU Note (Signed)
Pt reports not being able to keep down fluids for two days. Pt reports feeling dizzy and light headed.

## 2021-05-15 NOTE — MAU Provider Note (Signed)
History     CSN: [id number]  Arrival date and time: 05/15/21 1454   Event Date/Time   First Provider Initiated Contact with Patient 05/15/21 1518      Chief Complaint  Patient presents with   Emesis   HPI Margaret Goodman is a 25 y.o. G3P1011 at 57w2dwho presents with nausea and vomiting. This has been an ongoing problem during the pregnancy. She reports she has a scopolamine patch on, tried zofran and phenergan with no relief. She feels weak and tired. She denies any pain, vaginal bleeding or discharge.   OB History     Gravida  3   Para  1   Term  1   Preterm      AB  1   Living  1      SAB      IAB  1   Ectopic      Multiple  0   Live Births  1           Past Medical History:  Diagnosis Date   Depression    dr had conversation today about therapy before medicine   Hyperlipidemia    Hypotension    UTI (urinary tract infection)    Vaginal Pap smear, abnormal    ok since    Past Surgical History:  Procedure Laterality Date   NO PAST SURGERIES      Family History  Problem Relation Age of Onset   Healthy Mother    Healthy Father     Social History   Tobacco Use   Smoking status: Never   Smokeless tobacco: Never  Vaping Use   Vaping Use: Never used  Substance Use Topics   Alcohol use: Not Currently    Comment: ocassionally   Drug use: No    Allergies:  Allergies  Allergen Reactions   Tamiflu [Oseltamivir Phosphate] Hives    Medications Prior to Admission  Medication Sig Dispense Refill Last Dose   Blood Pressure Monitoring (BLOOD PRESSURE KIT) DEVI 1 each by Does not apply route once a week. 1 each 0 Past Week   glycopyrrolate (ROBINUL) 1 MG tablet Take 2 tablets (2 mg total) by mouth 3 (three) times daily. 90 tablet 4 Past Month   methimazole (TAPAZOLE) 5 MG tablet Take 1 tablet (5 mg total) by mouth daily. 30 tablet 1 Past Week   ondansetron (ZOFRAN ODT) 4 MG disintegrating tablet Take 1 tablet (4 mg total) by mouth every 8  (eight) hours as needed for nausea or vomiting. 20 tablet 11 05/15/2021   promethazine (PHENERGAN) 25 MG suppository Place 1 suppository (25 mg total) rectally every 6 (six) hours as needed for nausea or vomiting. 12 each 0 05/15/2021   scopolamine (TRANSDERM-SCOP) 1 MG/3DAYS Place 1 patch (1.5 mg total) onto the skin every 3 (three) days. 10 patch 12 05/15/2021   amoxicillin (AMOXIL) 875 MG tablet Take 875 mg by mouth 2 (two) times daily.      polyethylene glycol powder (GLYCOLAX/MIRALAX) 17 GM/SCOOP powder Take 17 g by mouth daily. 255 g 0    potassium chloride (KLOR-CON) 20 MEQ packet Take 40 mEq by mouth 2 (two) times daily for 3 days. 12 packet 0     Review of Systems  Constitutional: Negative.  Negative for fatigue and fever.  HENT: Negative.    Respiratory: Negative.  Negative for shortness of breath.   Cardiovascular: Negative.  Negative for chest pain.  Gastrointestinal:  Positive for nausea and vomiting. Negative for abdominal pain,  constipation and diarrhea.  Genitourinary: Negative.  Negative for dysuria, vaginal bleeding and vaginal discharge.  Neurological: Negative.  Negative for dizziness and headaches.  Physical Exam   Blood pressure (!) 100/57, pulse 86, temperature 98.9 F (37.2 C), resp. rate 16, height 5' 7"  (1.702 m), weight 70.2 kg, last menstrual period 12/31/2020, SpO2 98 %, unknown if currently breastfeeding.  Physical Exam Vitals and nursing note reviewed.  Constitutional:      General: She is not in acute distress.    Appearance: She is well-developed.  HENT:     Head: Normocephalic.  Eyes:     Pupils: Pupils are equal, round, and reactive to light.  Cardiovascular:     Rate and Rhythm: Normal rate and regular rhythm.     Heart sounds: Normal heart sounds.  Pulmonary:     Effort: Pulmonary effort is normal. No respiratory distress.     Breath sounds: Normal breath sounds.  Abdominal:     General: Bowel sounds are normal. There is no distension.      Palpations: Abdomen is soft.     Tenderness: There is no abdominal tenderness.  Skin:    General: Skin is warm and dry.  Neurological:     Mental Status: She is alert and oriented to person, place, and time.  Psychiatric:        Mood and Affect: Mood normal.        Behavior: Behavior normal.        Thought Content: Thought content normal.        Judgment: Judgment normal.     FHR: 152 bpm  MAU Course  Procedures Results for orders placed or performed during the hospital encounter of 05/15/21 (from the past 24 hour(s))  Urinalysis, Routine w reflex microscopic Urine, Clean Catch     Status: Abnormal   Collection Time: 05/15/21  3:05 PM  Result Value Ref Range   Color, Urine AMBER (A) YELLOW   APPearance CLOUDY (A) CLEAR   Specific Gravity, Urine 1.027 1.005 - 1.030   pH 5.0 5.0 - 8.0   Glucose, UA NEGATIVE NEGATIVE mg/dL   Hgb urine dipstick NEGATIVE NEGATIVE   Bilirubin Urine NEGATIVE NEGATIVE   Ketones, ur 80 (A) NEGATIVE mg/dL   Protein, ur 100 (A) NEGATIVE mg/dL   Nitrite NEGATIVE NEGATIVE   Leukocytes,Ua NEGATIVE NEGATIVE   RBC / HPF 6-10 0 - 5 RBC/hpf   WBC, UA 11-20 0 - 5 WBC/hpf   Bacteria, UA MANY (A) NONE SEEN   Squamous Epithelial / LPF 6-10 0 - 5   Mucus PRESENT   CBC     Status: Abnormal   Collection Time: 05/15/21  4:17 PM  Result Value Ref Range   WBC 5.6 4.0 - 10.5 K/uL   RBC 3.53 (L) 3.87 - 5.11 MIL/uL   Hemoglobin 11.5 (L) 12.0 - 15.0 g/dL   HCT 32.5 (L) 36.0 - 46.0 %   MCV 92.1 80.0 - 100.0 fL   MCH 32.6 26.0 - 34.0 pg   MCHC 35.4 30.0 - 36.0 g/dL   RDW 13.7 11.5 - 15.5 %   Platelets 192 150 - 400 K/uL   nRBC 0.0 0.0 - 0.2 %  Comprehensive metabolic panel     Status: Abnormal   Collection Time: 05/15/21  4:17 PM  Result Value Ref Range   Sodium 133 (L) 135 - 145 mmol/L   Potassium 3.6 3.5 - 5.1 mmol/L   Chloride 102 98 - 111 mmol/L   CO2 20 (L)  22 - 32 mmol/L   Glucose, Bld 60 (L) 70 - 99 mg/dL   BUN 5 (L) 6 - 20 mg/dL   Creatinine, Ser  0.74 0.44 - 1.00 mg/dL   Calcium 8.8 (L) 8.9 - 10.3 mg/dL   Total Protein 5.9 (L) 6.5 - 8.1 g/dL   Albumin 2.8 (L) 3.5 - 5.0 g/dL   AST 17 15 - 41 U/L   ALT 22 0 - 44 U/L   Alkaline Phosphatase 41 38 - 126 U/L   Total Bilirubin 0.9 0.3 - 1.2 mg/dL   GFR, Estimated >60 >60 mL/min   Anion gap 11 5 - 15     MDM UA LR bolus with Phenergan Multivitamin bolus Pepcid Compazine  Assessment and Plan   1. Nausea and vomiting during pregnancy prior to [redacted] weeks gestation   2. [redacted] weeks gestation of pregnancy   3. Nausea and vomiting during pregnancy    -Discharge home in stable condition -Rx for reglan and compazine sent to patient's pharmacy -Second trimester precautions discussed -Patient advised to follow-up with OB as scheduled for prenatal care -Patient may return to MAU as needed or if her condition were to change or worsen   Wende Mott CNM 05/15/2021, 3:19 PM

## 2021-05-17 ENCOUNTER — Encounter: Payer: Commercial Managed Care - PPO | Admitting: Obstetrics and Gynecology

## 2021-05-24 ENCOUNTER — Ambulatory Visit (HOSPITAL_BASED_OUTPATIENT_CLINIC_OR_DEPARTMENT_OTHER): Payer: Commercial Managed Care - PPO

## 2021-05-24 ENCOUNTER — Ambulatory Visit: Payer: Commercial Managed Care - PPO | Attending: Obstetrics and Gynecology

## 2021-05-24 ENCOUNTER — Ambulatory Visit: Payer: Commercial Managed Care - PPO | Admitting: *Deleted

## 2021-05-24 ENCOUNTER — Ambulatory Visit: Payer: Commercial Managed Care - PPO

## 2021-05-24 ENCOUNTER — Other Ambulatory Visit: Payer: Self-pay

## 2021-05-24 ENCOUNTER — Encounter: Payer: Self-pay | Admitting: *Deleted

## 2021-05-24 VITALS — BP 101/51 | HR 81

## 2021-05-24 DIAGNOSIS — E0590 Thyrotoxicosis, unspecified without thyrotoxic crisis or storm: Secondary | ICD-10-CM

## 2021-05-24 DIAGNOSIS — O283 Abnormal ultrasonic finding on antenatal screening of mother: Secondary | ICD-10-CM | POA: Insufficient documentation

## 2021-05-24 DIAGNOSIS — Z3480 Encounter for supervision of other normal pregnancy, unspecified trimester: Secondary | ICD-10-CM | POA: Insufficient documentation

## 2021-05-24 DIAGNOSIS — O99280 Endocrine, nutritional and metabolic diseases complicating pregnancy, unspecified trimester: Secondary | ICD-10-CM | POA: Diagnosis present

## 2021-05-24 DIAGNOSIS — O3509X1 Maternal care for (suspected) other central nervous system malformation or damage in fetus, fetus 1: Secondary | ICD-10-CM | POA: Diagnosis present

## 2021-05-24 DIAGNOSIS — O3501X1 Maternal care for (suspected) central nervous system malformation or damage in fetus, agenesis of the corpus callosum, fetus 1: Secondary | ICD-10-CM | POA: Insufficient documentation

## 2021-05-25 ENCOUNTER — Other Ambulatory Visit: Payer: Self-pay | Admitting: *Deleted

## 2021-05-25 DIAGNOSIS — O283 Abnormal ultrasonic finding on antenatal screening of mother: Secondary | ICD-10-CM

## 2021-05-25 NOTE — Progress Notes (Signed)
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   EDC: 10/07/2021 LMP: 12/31/2020 Referring Provider:  Mariel Aloe MD  EGA: [redacted]w[redacted]d Genetic Counselor: Teena Dunk, MS, CGC  OB Hx: [id number] Date of Appointment: 05/24/2021  Accompanied by: The father of the current pregnancy Face to Face Time: 20 Minutes     Genetic Counseling:   Agenesis of the Corpus Callosum (ACC). This is one of several disorders of the corpus callosum, the structure that connects the two cerebral hemispheres of the brain. In [facility], the corpus callosum is partially or completely absent. It can occur as an isolated condition or with other cerebral abnormalities including Arnold-Chiari Malformation, Dandy Walker Malformation, schizencephaly, and holoprosencephaly. The effects of the disorder range from mild to severe, depending on associated brain abnormalities and a fetal MRI may be useful to delineate the CNS anatomy. ACC can also be associated with malformations in other parts of the body. When ACC is visualized on ultrasound, fetal echocardiography should be performed to detect congenital heart disease. As many chromosomal abnormalities and other genetic syndromes have been described with ACC, genetic testing to determine whether a case is isolated or syndromic is warranted.  Joellyn Quails Malformation (DWM). This is a malformation involving the cerebellum (area of the back of the brain that coordinates movement) and the fluid filled spaces around it. The key features of this malformation are an enlargement of the fourth ventricle (small channel that allows fluid to flow freely between the upper and lower areas of the brain and spinal cord), a partial or complete absence of the area of the brain between the two cerebellar hemispheres (cerebellar vermis), and cyst formation near the lowest part of the skull. An increase in the size  and pressure of the fluid spaces surrounding the brain (hydrocephalus) may also be present. DWM is sometimes associated with abnormalities of other areas of the CNS, including agenesis of the corpus callosum, and malformations of the heart, face, limbs, fingers, and toes. When DWM is visualized on ultrasound, fetal echocardiography should be performed to detect any associated cardiac lesions. As many chromosomal abnormalities and other genetic syndromes have been described with DWM, genetic testing to determine whether a case isolated or syndromic is warranted.   Amniocentesis. Genetic counseling briefly described amniocentesis for prenatal diagnosis. Possible procedural difficulties and complications that can arise include maternal infection, cramping, bleeding, fluid leakage, and/or pregnancy loss. The risk for pregnancy loss with an amniocentesis is 1/500. Per the [employer]necologists (ACOG) Practice Bulletin 162, all pregnant women should be offered prenatal assessment for aneuploidy by diagnostic testing regardless of maternal age or other risk factors. Genetic testing that could be ordered on an amniocentesis sample includes a fetal karyotype, fetal microarray, and testing for specific syndromes.  Genetic counseling reviewed with the couple that there is an increased risk for the current pregnancy to be affected with chromosomal abnormalities or other genetic syndromes given the ultrasound findings of ACC and DWM. Genetic counseling briefly described amniocentesis for prenatal diagnosis. If Jataya opted to pursue amniocentesis and a genetic abnormality is identified, genetic counseling would be able to discuss the symptoms/prognosis of the condition identified with the couple in more detail. Seham was not interested in amniocentesis and shared with genetic counseling that she only wished to discuss the upcoming fetal MRI. We reviewed that a fetal MRI can supplement the  information obtained from ultrasound and can possibly provide  additional information regarding the baby' diagnosis. Maryse and her partner were appropriately worried about the ultrasound findings visualized today. After discussing with Dr. Parke Poisson further, Waldo Laine opted to pursue Non-Invasive Prenatal Screening (NIPS). Results will be communicated to the patient when they are available.   Thank you for sharing in the care of Kathleena with [place].  Please do not hesitate to contact us if you have any questions.  Teena Dunk, MS, [facility]

## 2021-05-26 ENCOUNTER — Encounter: Payer: Commercial Managed Care - PPO | Admitting: Obstetrics & Gynecology

## 2021-05-27 ENCOUNTER — Ambulatory Visit (INDEPENDENT_AMBULATORY_CARE_PROVIDER_SITE_OTHER): Payer: Commercial Managed Care - PPO | Admitting: Family Medicine

## 2021-05-27 ENCOUNTER — Other Ambulatory Visit: Payer: Self-pay

## 2021-05-27 ENCOUNTER — Encounter: Payer: Self-pay | Admitting: Family Medicine

## 2021-05-27 VITALS — BP 105/66 | HR 73 | Wt 166.9 lb

## 2021-05-27 DIAGNOSIS — O219 Vomiting of pregnancy, unspecified: Secondary | ICD-10-CM

## 2021-05-27 DIAGNOSIS — Z3A21 21 weeks gestation of pregnancy: Secondary | ICD-10-CM

## 2021-05-27 DIAGNOSIS — O09892 Supervision of other high risk pregnancies, second trimester: Secondary | ICD-10-CM

## 2021-05-27 DIAGNOSIS — Z2839 Encounter for supervision of normal pregnancy, unspecified, unspecified trimester: Secondary | ICD-10-CM

## 2021-05-27 DIAGNOSIS — E0590 Thyrotoxicosis, unspecified without thyrotoxic crisis or storm: Secondary | ICD-10-CM

## 2021-05-27 DIAGNOSIS — O09899 Supervision of other high risk pregnancies, unspecified trimester: Secondary | ICD-10-CM

## 2021-05-27 DIAGNOSIS — Z3480 Encounter for supervision of other normal pregnancy, unspecified trimester: Secondary | ICD-10-CM

## 2021-05-27 DIAGNOSIS — O99282 Endocrine, nutritional and metabolic diseases complicating pregnancy, second trimester: Secondary | ICD-10-CM | POA: Diagnosis not present

## 2021-05-27 DIAGNOSIS — E559 Vitamin D deficiency, unspecified: Secondary | ICD-10-CM

## 2021-05-27 DIAGNOSIS — O99280 Endocrine, nutritional and metabolic diseases complicating pregnancy, unspecified trimester: Secondary | ICD-10-CM

## 2021-05-27 DIAGNOSIS — O283 Abnormal ultrasonic finding on antenatal screening of mother: Secondary | ICD-10-CM

## 2021-05-27 NOTE — Progress Notes (Signed)
   Subjective:  Margaret Goodman is a 25 y.o. G3P1011 at 102w0d being seen today for ongoing prenatal care.  She is currently monitored for the following issues for this high-risk pregnancy and has Vitamin D deficiency; Non-reactive NST (non-stress test); Hyperthyroidism in pregnancy, antepartum; Supervision of other normal pregnancy, antepartum; [redacted] weeks gestation of pregnancy; Nausea and vomiting during pregnancy; and Abnormal fetal ultrasound on their problem list.  Patient reports  very nervous about her ultrasound findings .  Contractions: Not present. Vag. Bleeding: None.  Movement: Present. Denies leaking of fluid.   The following portions of the patient's history were reviewed and updated as appropriate: allergies, current medications, past family history, past medical history, past social history, past surgical history and problem list. Problem list updated.  Objective:   Vitals:   05/27/21 1021  BP: 105/66  Pulse: 73  Weight: 166 lb 14.4 oz (75.7 kg)    Fetal Status: Fetal Heart Rate (bpm): 141   Movement: Present     General:  Alert, oriented and cooperative. Patient is in no acute distress.  Skin: Skin is warm and dry. No rash noted.   Cardiovascular: Normal heart rate noted  Respiratory: Normal respiratory effort, no problems with respiration noted  Abdomen: Soft, gravid, appropriate for gestational age. Pain/Pressure: Present     Pelvic: Vag. Bleeding: None     Cervical exam deferred        Extremities: Normal range of motion.  Edema: None  Mental Status: Normal mood and affect. Normal behavior. Normal judgment and thought content.   Urinalysis:      Assessment and Plan:  Pregnancy: G3P1011 at [redacted]w[redacted]d  1. Supervision of other normal pregnancy, antepartum BP and FHR normal - AFP, Serum, Open Spina Bifida  2. Hyperthyroidism in pregnancy, antepartum Taking methimazole as prescribed Missed appt with Endo yesterday Will recheck TFT's today  3. Vitamin D  deficiency Check level today, none recently since 2019  4. Nausea and vomiting during pregnancy Much improved  5. 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 Please refer to After Visit Summary for other counseling recommendations.  Return in 4 weeks (on 06/24/2021) for [facility], ob visit, needs MD.   Venora Maples, MD

## 2021-05-27 NOTE — Patient Instructions (Signed)

## 2021-05-28 LAB — MATERNIT21 PLUS CORE+SCA
Fetal Fraction: 14
Monosomy X (Turner Syndrome): NOT DETECTED
Result (T21): NEGATIVE
Trisomy 13 (Patau syndrome): NEGATIVE
Trisomy 18 (Edwards syndrome): NEGATIVE
Trisomy 21 (Down syndrome): NEGATIVE
XXX (Triple X Syndrome): NOT DETECTED
XXY (Klinefelter Syndrome): NOT DETECTED
XYY (Jacobs Syndrome): NOT DETECTED

## 2021-05-29 LAB — AFP, SERUM, OPEN SPINA BIFIDA
AFP MoM: 1.27
AFP Value: 87.9 ng/mL
Gest. Age on Collection Date: 21 weeks
Maternal Age At EDD: 25.9 yr
OSBR Risk 1 IN: 10000
Test Results:: NEGATIVE
Weight: 167 [lb_av]

## 2021-05-30 ENCOUNTER — Other Ambulatory Visit: Payer: Self-pay | Admitting: *Deleted

## 2021-05-30 ENCOUNTER — Telehealth: Payer: Self-pay | Admitting: Genetics

## 2021-05-30 DIAGNOSIS — O283 Abnormal ultrasonic finding on antenatal screening of mother: Secondary | ICD-10-CM

## 2021-05-30 LAB — T4, FREE: Free T4: 0.58 ng/dL — ABNORMAL LOW (ref 0.82–1.77)

## 2021-05-30 LAB — TSH: TSH: 0.326 u[IU]/mL — ABNORMAL LOW (ref 0.450–4.500)

## 2021-05-30 LAB — T3: T3, Total: 129 ng/dL (ref 71–180)

## 2021-05-30 LAB — VITAMIN D 25 HYDROXY (VIT D DEFICIENCY, FRACTURES): Vit D, 25-Hydroxy: 19.4 ng/mL — ABNORMAL LOW (ref 30.0–100.0)

## 2021-05-30 NOTE — Telephone Encounter (Signed)
Called [facility] to return NIPS results. Left voicemail with genetic counseling call back number.

## 2021-05-31 ENCOUNTER — Telehealth: Payer: Self-pay

## 2021-05-31 MED ORDER — VITAMIN D 25 MCG (1000 UNIT) PO TABS: 1000.0000 [IU] | ORAL_TABLET | Freq: Every day | ORAL | 1 refills | Status: DC

## 2021-05-31 MED ORDER — METHIMAZOLE 5 MG PO TABS: 2.5000 mg | ORAL_TABLET | Freq: Every day | ORAL | 1 refills | Status: DC

## 2021-05-31 NOTE — Telephone Encounter (Signed)
-----   Message from Venora Maples, MD sent at 05/31/2021  8:26 AM EDT ----- Thyroid function excessively depressed for pregnancy Clinical staff please call patient and ask her to reduce methimazole dose from 5 mg to 2.5 mg, I have adjusted the order Her vitamin D level is also low, I have sent a supplement

## 2021-05-31 NOTE — Telephone Encounter (Signed)
Call placed to pt. No answer. Left VM for pt to return call to office. Will send mychart message to pt.  Judeth Cornfield, RN

## 2021-05-31 NOTE — Addendum Note (Signed)
Addended by: Merian Capron on: 05/31/2021 08:27 AM   Modules accepted: Orders

## 2021-05-31 NOTE — Progress Notes (Signed)
Thyroid function excessively depressed for pregnancy Clinical staff please call patient and ask her to reduce methimazole dose from 5 mg to 2.5 mg, I have adjusted the order Her vitamin D level is also low, I have sent a supplement

## 2021-06-01 NOTE — Telephone Encounter (Signed)
Call placed back to pt. Pt given results and recommendations per Dr Crissie Reese. Pt verbalized understanding and agreeable to plan of care.  Judeth Cornfield, RN

## 2021-06-07 NOTE — Telephone Encounter (Signed)
We spoke with Margaret Goodman regarding the MRI results which confirmed the ultrasound findings.   We have referred her to [facility] for a D&E.   I will keep you updated regarding when the procedure will be scheduled.   [name]

## 2021-06-17 ENCOUNTER — Other Ambulatory Visit: Payer: Self-pay

## 2021-06-21 ENCOUNTER — Ambulatory Visit: Payer: Commercial Managed Care - PPO

## 2021-06-23 ENCOUNTER — Encounter: Payer: Commercial Managed Care - PPO | Admitting: Obstetrics and Gynecology

## 2021-08-21 NOTE — L&D Delivery Note (Addendum)
OB/GYN Faculty Practice Delivery Note  Margaret Goodman is a 26 y.o. [id number] s/p SOL at [redacted]w[redacted]d. She was admitted for SOL.   ROM: 0h 67m with clear fluid GBS Status:  Negative/-- (09/18 1059) Maximum Maternal Temperature: 98.6  Labor Progress: Initial SVE: 7/70-80/-2. She then progressed to complete.   Delivery Date/Time: 05/19/22 2009 Delivery: Called to room and patient was complete and pushing. Head delivered . No nuchal cord present. Shoulder and body delivered in usual fashion. Infant with spontaneous cry, placed on mother's abdomen, dried and stimulated. Cord clamped x 2 after 1-minute delay, and cut by FOB. Cord blood drawn. Placenta delivered spontaneously with gentle cord traction. Fundus firm with massage and Pitocin. Labia, perineum, vagina, and cervix inspected with no lacerations.  Baby Weight: pending  Placenta: 3 vessel, intact. Sent to L&D Complications: None Lacerations: None EBL: 117 mL Analgesia: Epidural   Infant:  APGAR (1 MIN): 9   APGAR (5 MINS): [place], DO OB Family Medicine Fellow, [facility] for [facility], [place] [facility] 05/19/2022, 8:29 PM   Fellow ATTESTATION  I was present and gloved for this delivery and agree with the above documentation in the resident's note except as below.   Concepcion Living, MD [facility] [employer] (Faculty Practice) 05/19/2022, 8:32 PM

## 2021-09-16 ENCOUNTER — Encounter (INDEPENDENT_AMBULATORY_CARE_PROVIDER_SITE_OTHER): Payer: Self-pay

## 2021-09-30 ENCOUNTER — Encounter (INDEPENDENT_AMBULATORY_CARE_PROVIDER_SITE_OTHER): Payer: Self-pay

## 2021-10-16 ENCOUNTER — Other Ambulatory Visit: Payer: Self-pay

## 2021-10-16 ENCOUNTER — Inpatient Hospital Stay (HOSPITAL_COMMUNITY)
Admission: AD | Admit: 2021-10-16 | Discharge: 2021-10-17 | Disposition: A | Discharge status: Home / Self Care | Payer: 59 | Attending: Obstetrics & Gynecology | Admitting: Obstetrics & Gynecology

## 2021-10-16 ENCOUNTER — Encounter (HOSPITAL_COMMUNITY): Payer: Self-pay | Admitting: Obstetrics & Gynecology

## 2021-10-16 DIAGNOSIS — O26891 Other specified pregnancy related conditions, first trimester: Secondary | ICD-10-CM | POA: Insufficient documentation

## 2021-10-16 DIAGNOSIS — O208 Other hemorrhage in early pregnancy: Secondary | ICD-10-CM | POA: Diagnosis not present

## 2021-10-16 DIAGNOSIS — R102 Pelvic and perineal pain: Secondary | ICD-10-CM | POA: Insufficient documentation

## 2021-10-16 DIAGNOSIS — O468X1 Other antepartum hemorrhage, first trimester: Secondary | ICD-10-CM

## 2021-10-16 DIAGNOSIS — Z3A01 Less than 8 weeks gestation of pregnancy: Secondary | ICD-10-CM | POA: Diagnosis not present

## 2021-10-16 DIAGNOSIS — O200 Threatened abortion: Secondary | ICD-10-CM | POA: Insufficient documentation

## 2021-10-16 DIAGNOSIS — O418X10 Other specified disorders of amniotic fluid and membranes, first trimester, not applicable or unspecified: Secondary | ICD-10-CM

## 2021-10-16 LAB — URINALYSIS, ROUTINE W REFLEX MICROSCOPIC
Bilirubin Urine: NEGATIVE
Glucose, UA: NEGATIVE mg/dL
Hgb urine dipstick: NEGATIVE
Ketones, ur: 5 mg/dL — AB
Nitrite: NEGATIVE
Protein, ur: NEGATIVE mg/dL
Specific Gravity, Urine: 1.032 — ABNORMAL HIGH (ref 1.005–1.030)
pH: 5 (ref 5.0–8.0)

## 2021-10-16 LAB — POCT PREGNANCY, URINE: Preg Test, Ur: POSITIVE — AB

## 2021-10-16 NOTE — MAU Note (Signed)
Pt reports to MAU with c/o abdominal pain on the left side that has been intermittent.  Pt denies taking any pain medication for it.  Pt states that she has had a positive UPT at home for about 3 weeks.  No VB.  Pt reports LMP is 08/17/21.

## 2021-10-17 ENCOUNTER — Encounter (HOSPITAL_COMMUNITY): Payer: Self-pay | Admitting: Obstetrics & Gynecology

## 2021-10-17 ENCOUNTER — Inpatient Hospital Stay (HOSPITAL_COMMUNITY): Payer: 59

## 2021-10-17 DIAGNOSIS — O468X1 Other antepartum hemorrhage, first trimester: Secondary | ICD-10-CM | POA: Diagnosis not present

## 2021-10-17 DIAGNOSIS — O418X10 Other specified disorders of amniotic fluid and membranes, first trimester, not applicable or unspecified: Secondary | ICD-10-CM | POA: Diagnosis not present

## 2021-10-17 DIAGNOSIS — O26891 Other specified pregnancy related conditions, first trimester: Secondary | ICD-10-CM | POA: Diagnosis not present

## 2021-10-17 DIAGNOSIS — Z3A01 Less than 8 weeks gestation of pregnancy: Secondary | ICD-10-CM | POA: Diagnosis not present

## 2021-10-17 LAB — CBC
HCT: 36.8 % (ref 36.0–46.0)
Hemoglobin: 12.6 g/dL (ref 12.0–15.0)
MCH: 31.4 pg (ref 26.0–34.0)
MCHC: 34.2 g/dL (ref 30.0–36.0)
MCV: 91.8 fL (ref 80.0–100.0)
Platelets: 241 10*3/uL (ref 150–400)
RBC: 4.01 MIL/uL (ref 3.87–5.11)
RDW: 12 % (ref 11.5–15.5)
WBC: 5.5 10*3/uL (ref 4.0–10.5)
nRBC: 0 % (ref 0.0–0.2)

## 2021-10-17 LAB — GC/CHLAMYDIA PROBE AMP (~~LOC~~) NOT AT ARMC
Chlamydia: NEGATIVE
Comment: NEGATIVE
Comment: NORMAL
Neisseria Gonorrhea: NEGATIVE

## 2021-10-17 LAB — WET PREP, GENITAL
Sperm: NONE SEEN
Trich, Wet Prep: NONE SEEN
WBC, Wet Prep HPF POC: <10 (ref <10)
Yeast Wet Prep HPF POC: NONE SEEN

## 2021-10-17 LAB — HIV ANTIBODY (ROUTINE TESTING W REFLEX): HIV Screen 4th Generation wRfx: NONREACTIVE

## 2021-10-17 LAB — HCG, QUANTITATIVE, PREGNANCY: hCG, Beta Chain, Quant, S: 218123 m[IU]/mL — ABNORMAL HIGH (ref <5)

## 2021-10-17 NOTE — Discharge Instructions (Signed)
Return to MAU: °If you have heavy bleeding that soaks through more that 2 pads per hour for an hour or more °If you bleed so much that you feel like you might pass out or you do pass out °If you have significant abdominal pain that is not improved with Tylenol 1000 mg every 8 hours as needed for pain °If you develop a fever > 100.5  °

## 2021-10-17 NOTE — MAU Provider Note (Signed)
History     CSN: [id number]  Arrival date and time: 10/16/21 2310   Event Date/Time   First Provider Initiated Contact with Patient 10/17/21 0009      Chief Complaint  Patient presents with   Abdominal Pain   [name] is a 26 y.o. year old G22P1021 female at 17w5dweeks gestation who presents to MAU reporting LT side intermittent abdominal pain. She has not taken any medication for pain tonight. She reports having a (+) HPT about 3 weeks ago; LMP was 08/17/2022. She denies VB. She plans to establish care with [facility], but was unable to get scheduled. She was told she needed confirmation of pregnancy first.   OB History     Gravida  4   Para  1   Term  1   Preterm      AB  2   Living  1      SAB  1   IAB  1   Ectopic      Multiple  0   Live Births  1           Past Medical History:  Diagnosis Date   Depression    dr had conversation today about therapy before medicine   Hyperlipidemia    Hypotension    UTI (urinary tract infection)    Vaginal Pap smear, abnormal    ok since    Past Surgical History:  Procedure Laterality Date   NO PAST SURGERIES      Family History  Problem Relation Age of Onset   Healthy Mother    Healthy Father     Social History   Tobacco Use   Smoking status: Never   Smokeless tobacco: Never  Vaping Use   Vaping Use: Never used  Substance Use Topics   Alcohol use: Not Currently    Comment: ocassionally   Drug use: No    Allergies:  Allergies  Allergen Reactions   Tamiflu [Oseltamivir Phosphate] Hives    Medications Prior to Admission  Medication Sig Dispense Refill Last Dose   glycopyrrolate (ROBINUL) 1 MG tablet Take 2 tablets (2 mg total) by mouth 3 (three) times daily. 90 tablet 4 10/16/2021   metoCLOPramide (REGLAN) 10 MG tablet Take 1 tablet (10 mg total) by mouth every 6 (six) hours. 30 tablet 3 10/16/2021   polyethylene glycol (MIRALAX / GLYCOLAX) 17 g packet Take 17 g by  mouth daily.   Past Month   prochlorperazine (COMPAZINE) 10 MG tablet Take 1 tablet (10 mg total) by mouth 2 (two) times daily as needed for nausea or vomiting. 30 tablet 3 10/16/2021   scopolamine (TRANSDERM-SCOP) 1 MG/3DAYS Place 1 patch (1.5 mg total) onto the skin every 3 (three) days. 10 patch 12 10/16/2021   Blood Pressure Monitoring (BLOOD PRESSURE KIT) DEVI 1 each by Does not apply route once a week. (Patient not taking: Reported on 05/27/2021) 1 each 0 Unknown   cholecalciferol (VITAMIN D3) 25 MCG (1000 UNIT) tablet Take 1 tablet (1,000 Units total) by mouth daily. 90 tablet 1 Unknown   famotidine (PEPCID) 20 MG tablet Take 20 mg by mouth 2 (two) times daily.   Unknown   methimazole (TAPAZOLE) 5 MG tablet Take 0.5 tablets (2.5 mg total) by mouth daily. 30 tablet 1 Unknown   ondansetron (ZOFRAN ODT) 4 MG disintegrating tablet Take 1 tablet (4 mg total) by mouth every 8 (eight) hours as needed for nausea or vomiting. 20 tablet 11 Unknown   potassium  chloride (KLOR-CON) 20 MEQ packet Take 40 mEq by mouth 2 (two) times daily for 3 days. 12 packet 0    promethazine (PHENERGAN) 25 MG suppository Place 1 suppository (25 mg total) rectally every 6 (six) hours as needed for nausea or vomiting. 12 each 11 Unknown   promethazine (PHENERGAN) 25 MG tablet Take 1 tablet (25 mg total) by mouth every 6 (six) hours as needed for nausea or vomiting. 30 tablet 3 Unknown    Review of Systems  Constitutional: Negative.   HENT: Negative.    Eyes: Negative.   Respiratory: Negative.    Cardiovascular: Negative.   Gastrointestinal: Negative.   Endocrine: Negative.   Genitourinary:  Positive for pelvic pain (LT side, intermittent).  Musculoskeletal: Negative.   Allergic/Immunologic: Negative.   Neurological: Negative.   Hematological: Negative.   Psychiatric/Behavioral: Negative.    Physical Exam   Blood pressure (!) 114/58, pulse 81, temperature 98.3 F (36.8 C), temperature source Oral, resp.  rate 16, height 5' 7" (1.702 m), weight 91 kg, last menstrual period 08/17/2021, SpO2 99 %, unknown if currently breastfeeding.  Physical Exam Vitals and nursing note reviewed. Exam conducted with a chaperone present.  Constitutional:      Appearance: Normal appearance. She is normal weight.  Cardiovascular:     Rate and Rhythm: Normal rate.  Pulmonary:     Effort: Pulmonary effort is normal.  Abdominal:     Palpations: Abdomen is soft.  Genitourinary:    General: Normal vulva.     Comments: Pelvic exam: External genitalia normal, SE: vaginal walls pink and well rugated, cervix is smooth, pink, no lesions, small amt of thick, white vaginal d/c -- WP, GC/CT done, cervix visually closed, Uterus is non-tender, mild CMT, no friability, no adnexal tenderness.  Musculoskeletal:        General: Normal range of motion.  Skin:    General: Skin is warm and dry.  Neurological:     Mental Status: She is alert and oriented to person, place, and time.  Psychiatric:        Mood and Affect: Mood normal.        Behavior: Behavior normal.        Thought Content: Thought content normal.        Judgment: Judgment normal.    MAU Course  Procedures  MDM CCUA UCx -- Results pending  UPT CBC ABO/Rh HCG Wet Prep GC/CT -- pending HIV -- pending OB < 14 wks [place] with TV  Results for orders placed or performed during the hospital encounter of 10/16/21 (from the past 24 hour(s))  Urinalysis, Routine w reflex microscopic Urine, Clean Catch     Status: Abnormal   Collection Time: 10/16/21 11:26 PM  Result Value Ref Range   Color, Urine YELLOW YELLOW   APPearance CLOUDY (A) CLEAR   Specific Gravity, Urine 1.032 (H) 1.005 - 1.030   pH 5.0 5.0 - 8.0   Glucose, UA NEGATIVE NEGATIVE mg/dL   Hgb urine dipstick NEGATIVE NEGATIVE   Bilirubin Urine NEGATIVE NEGATIVE   Ketones, ur 5 (A) NEGATIVE mg/dL   Protein, ur NEGATIVE NEGATIVE mg/dL   Nitrite NEGATIVE NEGATIVE   Leukocytes,Ua LARGE (A) NEGATIVE    RBC / HPF 0-5 0 - 5 RBC/hpf   WBC, UA 21-50 0 - 5 WBC/hpf   Bacteria, UA MANY (A) NONE SEEN   Squamous Epithelial / LPF 21-50 0 - 5   Mucus PRESENT   Pregnancy, urine POC     Status: Abnormal  Time: 10/16/21 11:28 PM  °Result Value Ref Range  ° Preg Test, Ur POSITIVE (A) NEGATIVE  °Wet prep, genital     Status: Abnormal  ° Collection Time: 10/17/21 12:28 AM  °Result Value Ref Range  ° Yeast Wet Prep HPF POC NONE SEEN NONE SEEN  ° Trich, Wet Prep NONE SEEN NONE SEEN  ° Clue Cells Wet Prep HPF POC PRESENT (A) NONE SEEN  ° WBC, Wet Prep HPF POC <10 <10  ° Sperm NONE SEEN   °CBC     Status: None  ° Collection Time: 10/17/21 12:33 AM  °Result Value Ref Range  ° WBC 5.5 4.0 - 10.5 K/uL  ° RBC 4.01 3.87 - 5.11 MIL/uL  ° Hemoglobin 12.6 12.0 - 15.0 g/dL  ° HCT 36.8 36.0 - 46.0 %  ° MCV 91.8 80.0 - 100.0 fL  ° MCH 31.4 26.0 - 34.0 pg  ° MCHC 34.2 30.0 - 36.0 g/dL  ° RDW 12.0 11.5 - 15.5 %  ° Platelets 241 150 - 400 K/uL  ° nRBC 0.0 0.0 - 0.2 %  ° ° °US OB Comp Less 14 Wks ° °Result Date: 10/17/2021 °CLINICAL DATA:  Abdominal pain. LMP: 08/17/2021 corresponding to an estimated gestational age of [redacted] weeks, 5 days. EXAM: OBSTETRIC <14 WK ULTRASOUND TECHNIQUE: Transabdominal ultrasound was performed for evaluation of the gestation as well as the maternal uterus and adnexal regions. COMPARISON:  None. FINDINGS: Intrauterine gestational sac: Single intrauterine gestational sac. Yolk sac:  Seen Embryo:  Present Cardiac Activity: Detected Heart Rate: 161 bpm CRL:   16 mm   7 w 6 d                  US EDC: 05/30/2022 Subchorionic hemorrhage: Small subchorionic hemorrhage measuring of 219 mm. Maternal uterus/adnexae: The maternal ovaries are unremarkable. There is a corpus luteum in the left ovary. IMPRESSION: Single live intrauterine pregnancy with an estimated gestational age of [redacted] weeks, 6 days. Electronically Signed   By: Arash  Radparvar M.D.   On: 10/17/2021 01:00    ° °Assessment and Plan  °Abdominal pain  during pregnancy in first trimester  °- Pending UCx ° °Subchorionic hematoma in first trimester, single or unspecified fetus °- Information provided on SCH  ° °Threatened miscarriage in early pregnancy °- Information provided on threatened miscarriage ° °[redacted] weeks gestation of pregnancy  ° °- Discharge home °- Schedule appt to establish PNC with [facility] -- call in the morning to inform them was seen in MAU on 10/17/2021 °- Has appt with endocrinologist 10/20/2021 -- will seek advise about need for Tapazole with this pregnancy °- Patient verbalized an understanding of the plan of care and agrees.  ° °Rolitta Dawson, CNM °10/17/2021, 12:09 AM  °

## 2021-10-18 LAB — CULTURE, OB URINE: Culture: >=100000 — AB

## 2021-10-20 ENCOUNTER — Ambulatory Visit (INDEPENDENT_AMBULATORY_CARE_PROVIDER_SITE_OTHER): Payer: 59 | Admitting: Endocrinology

## 2021-10-20 ENCOUNTER — Encounter: Payer: Self-pay | Admitting: Endocrinology

## 2021-10-20 ENCOUNTER — Other Ambulatory Visit: Payer: Self-pay

## 2021-10-20 VITALS — BP 110/60 | HR 82 | Ht 67.0 in | Wt 201.2 lb

## 2021-10-20 DIAGNOSIS — O99280 Endocrine, nutritional and metabolic diseases complicating pregnancy, unspecified trimester: Secondary | ICD-10-CM | POA: Diagnosis not present

## 2021-10-20 DIAGNOSIS — E0590 Thyrotoxicosis, unspecified without thyrotoxic crisis or storm: Secondary | ICD-10-CM

## 2021-10-20 LAB — TSH: TSH: 0.05 u[IU]/mL — ABNORMAL LOW (ref 0.35–5.50)

## 2021-10-20 LAB — T4, FREE: Free T4: 0.82 ng/dL (ref 0.60–1.60)

## 2021-10-20 NOTE — Patient Instructions (Addendum)
Blood tests are requested for you today.  We'll let you know about the results.   ?If it is significantly high, I'll prescribe PTU for you.   ?During pregnancy, we treat the thyroid less aggressively, not to the normal range.   ?Please come back for a follow-up appointment in 2 months.   ?

## 2021-10-20 NOTE — Progress Notes (Addendum)
? ?Subjective:  ? ? Patient ID: Margaret Goodman, female    DOB: 1995-09-24, 25 y.o.   MRN: [id number] ? ?HPI ?Pt is referred by Dr Elgie Congo, for hyperthyroidism.  Pt reports he was dx'ed with hyperthyroidism in 2022.  she took tapazole x a few mos in 2022.  she has never had XRT to the anterior neck, or thyroid surgery.  she has never had thyroid imaging.  she does not consume non-prescribed thyroid medication.  She is now [redacted] weeks pregnant.  She has intermitt palpitations and heat intolerance.  Nausea is mild.   ?Past Medical History:  ?Diagnosis Date  ? Depression   ? dr had conversation today about therapy before medicine  ? Hyperlipidemia   ? Hypotension   ? UTI (urinary tract infection)   ? Vaginal Pap smear, abnormal   ? ok since  ? ? ?Past Surgical History:  ?Procedure Laterality Date  ? NO PAST SURGERIES    ? ? ?Social History  ? ?Socioeconomic History  ? Marital status: Single  ?  Spouse name: Not on file  ? Number of children: Not on file  ? Years of education: Not on file  ? Highest education level: Not on file  ?Occupational History  ? Not on file  ?Tobacco Use  ? Smoking status: Never  ? Smokeless tobacco: Never  ?Vaping Use  ? Vaping Use: Never used  ?Substance and Sexual Activity  ? Alcohol use: Not Currently  ?  Comment: ocassionally  ? Drug use: No  ? Sexual activity: Yes  ?  Birth control/protection: None  ?Other Topics Concern  ? Not on file  ?Social History Narrative  ? Not on file  ? ?Social Determinants of Health  ? ?Financial Resource Strain: Not on file  ?Food Insecurity: Not on file  ?Transportation Needs: Not on file  ?Physical Activity: Not on file  ?Stress: Not on file  ?Social Connections: Not on file  ?Intimate Partner Violence: Not on file  ? ? ?Current Outpatient Medications on File Prior to Visit  ?Medication Sig Dispense Refill  ? Blood Pressure Monitoring (BLOOD PRESSURE KIT) DEVI 1 each by Does not apply route once a week. 1 each 0  ? cholecalciferol (VITAMIN D3) 25 MCG (1000 UNIT) tablet  Take 1 tablet (1,000 Units total) by mouth daily. 90 tablet 1  ? famotidine (PEPCID) 20 MG tablet Take 20 mg by mouth 2 (two) times daily.    ? metoCLOPramide (REGLAN) 10 MG tablet Take 1 tablet (10 mg total) by mouth every 6 (six) hours. 30 tablet 3  ? ondansetron (ZOFRAN ODT) 4 MG disintegrating tablet Take 1 tablet (4 mg total) by mouth every 8 (eight) hours as needed for nausea or vomiting. 20 tablet 11  ? polyethylene glycol (MIRALAX / GLYCOLAX) 17 g packet Take 17 g by mouth daily.    ? prochlorperazine (COMPAZINE) 10 MG tablet Take 1 tablet (10 mg total) by mouth 2 (two) times daily as needed for nausea or vomiting. 30 tablet 3  ? promethazine (PHENERGAN) 25 MG suppository Place 1 suppository (25 mg total) rectally every 6 (six) hours as needed for nausea or vomiting. 12 each 11  ? promethazine (PHENERGAN) 25 MG tablet Take 1 tablet (25 mg total) by mouth every 6 (six) hours as needed for nausea or vomiting. 30 tablet 3  ? scopolamine (TRANSDERM-SCOP) 1 MG/3DAYS Place 1 patch (1.5 mg total) onto the skin every 3 (three) days. 10 patch 12  ? ?No current facility-administered medications on  file prior to visit.  ? ? ?Allergies  ?Allergen Reactions  ? Tamiflu [Oseltamivir Phosphate] Hives  ? ? ?Family History  ?Problem Relation Age of Onset  ? Healthy Mother   ? Healthy Father   ? Thyroid disease Neg Hx   ? ? ?BP 110/60   Pulse 82   Ht _0  (1.702 m)   Wt 201 lb 3.2 oz (91.3 kg)   LMP 08/17/2021   SpO2 95%   BMI 31.51 kg/m?  ? ? ?Review of Systems ?denies weight loss, sob, excessive diaphoresis, tremor, and anxiety.   ?   ?Objective:  ? Physical Exam ?VS: see vs page ?GEN: no distress ?HEAD: head: no deformity ?eyes: no periorbital swelling, no proptosis ?external nose and ears are normal ?NECK: supple, thyroid is not enlarged ?CHEST WALL: no deformity ?LUNGS: clear to auscultation ?CV: reg rate and rhythm, no murmur.  ?MUSCULOSKELETAL: gait is normal and steady ?EXTEMITIES: no deformity.  no leg  edema ?NEURO:  readily moves all 4's.  sensation is intact to touch on all 4's.  No tremor.   ?SKIN:  Normal texture and temperature.  No rash or suspicious lesion is visible.  Not diaphoretic ?NODES:  None palpable at the neck ?PSYCH: alert, well-oriented.  Does not appear anxious nor depressed. ? ? ? ?I have reviewed outside records, and summarized: ?Pt was noted to have abnormal TFT, and referred here.  She had threatened miscarriage.   ? ?Lab Results  ?Component Value Date  ? TSH 0.05 (L) 10/20/2021  ? T3TOTAL 129 05/27/2021  ? ?   ?Assessment & Plan:  ?Hyperthyroidism, during pregnancy.   ?Threatened miscarriage.  I'll discuss with other endocrinologists the possible rx of abnormal TFT in this setting.  We'll hold off on medication for now.   ?Addendum 10/24/21: I have discussed with 2 other drs.  Despite the above, these TFT are not abnormal enough to mandate medication now.   ? ?

## 2021-10-23 ENCOUNTER — Encounter: Payer: Self-pay | Admitting: Internal Medicine

## 2021-10-26 ENCOUNTER — Encounter: Payer: Self-pay | Admitting: Endocrinology

## 2021-10-26 ENCOUNTER — Other Ambulatory Visit: Payer: Self-pay | Admitting: Endocrinology

## 2021-10-26 MED ORDER — PROPYLTHIOURACIL 50 MG PO TABS: 50.0000 mg | ORAL_TABLET | Freq: Every day | ORAL | 1 refills | Status: DC

## 2021-11-01 ENCOUNTER — Telehealth (INDEPENDENT_AMBULATORY_CARE_PROVIDER_SITE_OTHER): Payer: 59

## 2021-11-01 DIAGNOSIS — Z3480 Encounter for supervision of other normal pregnancy, unspecified trimester: Secondary | ICD-10-CM

## 2021-11-01 DIAGNOSIS — O0993 Supervision of high risk pregnancy, unspecified, third trimester: Secondary | ICD-10-CM | POA: Insufficient documentation

## 2021-11-01 DIAGNOSIS — Z3A00 Weeks of gestation of pregnancy not specified: Secondary | ICD-10-CM

## 2021-11-01 NOTE — Patient Instructions (Signed)
  At our [facility], we work as an integrated team, providing care to address both physical and emotional health. Your medical provider may refer you to see our Behavioral Health Clinician (BHC) on the same day you see your medical provider, as availability permits; often scheduled virtually at your convenience.  Our BHC is available to all patients, visits generally last between 20-30 minutes, but can be longer or shorter, depending on patient need. The BHC offers help with stress management, coping with symptoms of depression and anxiety, major life changes , sleep issues, changing risky behavior, grief and loss, life stress, working on personal life goals, and  behavioral health issues, as these all affect your overall health and wellness.  The BHC is NOT available for the following: FMLA paperwork, court-ordered evaluations, specialty assessments (custody or disability), letters to employers, or obtaining certification for an emotional support animal. The BHC does not provide long-term therapy. You have the right to refuse integrated behavioral health services, or to reschedule to see the BHC at a later date.  Confidentiality exception: If it is suspected that a child or disabled adult is being abused or neglected, we are required by law to report that to either Child Protective Services or Adult Protective Services.  If you have a diagnosis of Bipolar affective disorder, Schizophrenia, or recurrent Major depressive disorder, we will recommend that you establish care with a psychiatrist, as these are lifelong, chronic conditions, and we want your overall emotional health and medications to be more closely monitored. If you anticipate needing extended maternity leave due to mental health issues postpartum, it it recommended you inform your medical provider, so we can put in a referral to a psychiatrist as soon as possible. The BHC is unable to recommend an extended maternity leave for mental  health issues. Your medical provider or BHC may refer you to a therapist for ongoing, traditional therapy, or to a psychiatrist, for medication management, if it would benefit your overall health. Depending on your insurance, you may have a copay or be charged a deductible, depending on your insurance, to see the BHC. If you are uninsured, it is recommended that you apply for financial assistance. (Forms may be requested at the front desk for in-person visits, via MyChart, or request a form during a virtual visit).  If you see the BHC more than 6 times, you will have to complete a comprehensive clinical assessment interview with the BHC to resume integrated services.  For virtual visits with the BHC, you must be physically in the state of [place] at the time of the visit. For example, if you live in [place], you will have to do an in-person visit with the BHC, and your out-of-state insurance may not cover behavioral health services in [place]. If you are going out of the state or country for any reason, the BHC may see you virtually when you return to [place], but not while you are physically outside of [place].    

## 2021-11-01 NOTE — Progress Notes (Signed)
New OB Intake ? ?I connected with  Margaret Goodman on 11/01/21 at  3:15 PM EDT by MyChart Video Visit and verified that I am speaking with the correct person using two identifiers. Nurse is located at [facility] and pt is located at [employer]. ? ?I discussed the limitations, risks, security and privacy concerns of performing an evaluation and management service by telephone and the availability of in person appointments. I also discussed with the patient that there may be a patient responsible charge related to this service. The patient expressed understanding and agreed to proceed. ? ?I explained I am completing New OB Intake today. We discussed her EDD of 05/30/22 that is based on U/S on 10/17/21. Pt is G4/P1. I reviewed her allergies, medications, Medical/Surgical/OB history, and appropriate screenings. I informed her of [facility] services. Based on history, this is a/an  pregnancy uncomplicated .  ? ?Patient Active Problem List  ? Diagnosis Date Noted  ? Abnormal fetal ultrasound 05/27/2021  ? Supervision of other normal pregnancy, antepartum 05/05/2021  ? [redacted] weeks gestation of pregnancy 05/05/2021  ? Nausea and vomiting during pregnancy 05/05/2021  ? Hyperthyroidism in pregnancy, antepartum 05/02/2021  ? Non-reactive NST (non-stress test) 05/18/2018  ? Vitamin D deficiency 11/19/2017  ? ? ?Concerns addressed today ? ?Delivery Plans:  ?Plans to deliver at [facility].  ? ?MyChart/Babyscripts ?MyChart access verified. I explained pt will have some visits in office and some virtually. Babyscripts instructions given and order placed. Patient verifies receipt of registration text/e-mail. Account successfully created and app downloaded. ? ?Blood Pressure Cuff  ?Patient has private insurance; instructed to purchase blood pressure cuff and bring to first prenatal appt. Explained after first prenatal appt pt will check weekly and document in Babyscripts. ? ?Weight scale: Patient does / does not  have weight scale. Weight scale ordered  for patient to pick up from [employer].  ? ?Anatomy US ?Explained first scheduled [place] will be around 19 weeks. Anatomy US scheduled for 12/28/21 at 0930. Pt notified to arrive at 0915. ?Scheduled AFP lab only appointment if CenteringPregnancy pt for same day as anatomy US.  ? ?Labs ?Discussed Avelina Laine genetic screening with patient. Would like both Panorama and Horizon drawn at new OB visit.Also if interested in genetic testing, tell patient she will need AFP 15-21 weeks to complete genetic testing .Routine prenatal labs needed. ? ?Covid Vaccine ?Patient has covid vaccine.  ? ?Is patient a CenteringPregnancy candidate? Accepted "Centering Patient" indicated on sticky note ?  ?Is patient a Mom+Baby Combined Care candidate? Not a candidate   Scheduled with Mom+Baby provider  ?  ?Is patient interested in [place]? No  "Interested in [employer] - Schedule next visit with CNM" on sticky note ? ?Informed patient of [facility] website  and placed link in her AVS.  ? ?Social Determinants of Health ?Food Insecurity: Patient denies food insecurity. ?WIC Referral: Patient is interested in referral to [facility].  ?Transportation: Patient denies transportation needs. ?Childcare: Discussed no children allowed at ultrasound appointments. Offered childcare services; patient declines childcare services at this time. ? ?Send link to Pregnancy Navigators ? ? ?Placed OB Box on problem list and updated ? ?First visit review ?I reviewed new OB appt with pt. I explained she will have a pelvic exam, ob bloodwork with genetic screening, and PAP smear. Explained pt will be seen by Dr. Crissie Reese at first visit; encounter routed to appropriate provider. Explained that patient will be seen by pregnancy navigator following visit with provider. [facility] information placed in  AVS.  ? ?Henrietta Dine, CMA ?11/01/2021  3:21 PM  ?

## 2021-11-07 ENCOUNTER — Encounter: Payer: Self-pay | Admitting: Advanced Practice Midwife

## 2021-11-07 DIAGNOSIS — O358XX0 Maternal care for other (suspected) fetal abnormality and damage, not applicable or unspecified: Secondary | ICD-10-CM | POA: Insufficient documentation

## 2021-11-08 ENCOUNTER — Ambulatory Visit (INDEPENDENT_AMBULATORY_CARE_PROVIDER_SITE_OTHER): Payer: 59 | Admitting: Advanced Practice Midwife

## 2021-11-08 ENCOUNTER — Other Ambulatory Visit: Payer: Self-pay

## 2021-11-08 VITALS — BP 126/72 | Wt 193.6 lb

## 2021-11-08 DIAGNOSIS — O99280 Endocrine, nutritional and metabolic diseases complicating pregnancy, unspecified trimester: Secondary | ICD-10-CM

## 2021-11-08 DIAGNOSIS — B977 Papillomavirus as the cause of diseases classified elsewhere: Secondary | ICD-10-CM

## 2021-11-08 DIAGNOSIS — E0590 Thyrotoxicosis, unspecified without thyrotoxic crisis or storm: Secondary | ICD-10-CM

## 2021-11-08 DIAGNOSIS — O358XX0 Maternal care for other (suspected) fetal abnormality and damage, not applicable or unspecified: Secondary | ICD-10-CM

## 2021-11-08 DIAGNOSIS — F4321 Adjustment disorder with depressed mood: Secondary | ICD-10-CM

## 2021-11-08 DIAGNOSIS — Z3A11 11 weeks gestation of pregnancy: Secondary | ICD-10-CM

## 2021-11-09 ENCOUNTER — Encounter: Payer: Self-pay | Admitting: Advanced Practice Midwife

## 2021-11-09 DIAGNOSIS — B977 Papillomavirus as the cause of diseases classified elsewhere: Secondary | ICD-10-CM | POA: Insufficient documentation

## 2021-11-09 NOTE — Progress Notes (Signed)
? ?  PRENATAL VISIT NOTE ? ?Subjective:  ?Margaret Goodman is a 26 y.o. G4P1021 at [redacted]w[redacted]d being seen today for ongoing prenatal care.  She is currently monitored for the following issues for this high-risk pregnancy and has Vitamin D deficiency; Hyperthyroidism in pregnancy, antepartum; Nausea and vomiting during pregnancy; Supervision of other normal pregnancy, antepartum; Family historic risk of congenital abnormality; and HPV in female on their problem list.  ? 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 Pt tearful.   ? ?Patient reports small amount of white vaginal discharge. Denies odor, bleeding, cramping or itching.  Contractions: Not present. Vag. Bleeding: None.  Movement: Absent. Denies leaking of fluid.  ? ?The following portions of the patient's history were reviewed and updated as appropriate: allergies, current medications, past family history, past medical history, past social history, past surgical history and problem list.  ? ?Objective:  ? ?Vitals:  ? 11/08/21 1207  ?BP: 126/72  ?Weight: 193 lb 9.6 oz (87.8 kg)  ? ? ?Fetal Status: Fetal Heart Rate (bpm): 147   Movement: Absent    ? ?General:  Alert, oriented and cooperative. Patient is in no acute distress.  ?Skin: Skin is warm and dry. No rash noted.   ?Cardiovascular: Normal heart rate noted  ?Respiratory: Normal respiratory effort, no problems with respiration noted  ?Abdomen: Soft, gravid, appropriate for gestational age.  Pain/Pressure: Absent     ?Pelvic: Cervical exam deferred        ?Extremities: Normal range of motion.  Edema: None  ?Mental Status: Normal mood and affect. Normal behavior. Normal judgment and thought content.  ? ?Assessment and Plan:  ?Pregnancy: [id number] at [redacted]w[redacted]d ?1. HPV in female ?- Nml Pap 2020. ?- Plan Pap at [facility] 11/15/21 ? ?2. Family or maternal historic risk of congenital anomaly, antepartum, single or unspecified fetus ?- Plan Genetic Counseling and MFM [place] ?- [facility]er to [facility]   ?- Support given ? ?3. Hyperthyroidism in pregnancy, antepartum ?- Followed by Renato Shin Endocrinologist. Last seen 10/24/21. Holding meds for now.  ?- F/U appt 12/23/21 ? ?Preterm labor symptoms and general obstetric precautions including but not limited to vaginal bleeding, contractions, leaking of fluid and fetal movement were reviewed in detail with the patient. ?Please refer to After Visit Summary for other counseling recommendations.  ? ?No follow-ups on file. ? ?Future Appointments  ?Date Time Provider [place]  ?11/15/2021  9:55 AM Dione Plover Annice Needy, MD [facility]  ?12/06/2021  9:00 AM CENTERING PROVIDER [facility] [place]  ?12/23/2021  9:30 AM Renato Shin, MD [facility] [place]  ?12/28/2021  9:15 AM [facility] NURSE [facility]  ?12/28/2021  9:30 AM [facility] US2 [facility]  ?01/03/2022  9:00 AM CENTERING PROVIDER [facility] [place]  ?01/31/2022  9:00 AM CENTERING PROVIDER [facility] [place]  ?02/14/2022  9:00 AM CENTERING PROVIDER [facility] [place]  ?02/28/2022  9:00 AM CENTERING PROVIDER [facility] [place]  ?03/14/2022  9:00 AM CENTERING PROVIDER [facility] [place]  ?03/28/2022  9:00 AM CENTERING PROVIDER [facility] [place]  ?04/11/2022  9:00 AM CENTERING PROVIDER [facility] [place]  ?04/25/2022  9:00 AM CENTERING PROVIDER [facility] [place]  ? ? ?Manya Silvas, CNM ? ?

## 2021-11-10 NOTE — Addendum Note (Signed)
Addended by: Dorathy Kinsman on: 11/10/2021 12:48 PM ? ? Modules accepted: Orders ? ?

## 2021-11-15 ENCOUNTER — Encounter: Payer: Self-pay | Admitting: Family Medicine

## 2021-11-15 ENCOUNTER — Other Ambulatory Visit: Payer: Self-pay

## 2021-11-15 ENCOUNTER — Ambulatory Visit (INDEPENDENT_AMBULATORY_CARE_PROVIDER_SITE_OTHER): Payer: 59 | Admitting: Family Medicine

## 2021-11-15 ENCOUNTER — Encounter: Payer: 59 | Admitting: Family Medicine

## 2021-11-15 VITALS — BP 124/80 | HR 99 | Wt 192.0 lb

## 2021-11-15 DIAGNOSIS — Z3480 Encounter for supervision of other normal pregnancy, unspecified trimester: Secondary | ICD-10-CM

## 2021-11-15 DIAGNOSIS — O358XX0 Maternal care for other (suspected) fetal abnormality and damage, not applicable or unspecified: Secondary | ICD-10-CM

## 2021-11-15 DIAGNOSIS — F419 Anxiety disorder, unspecified: Secondary | ICD-10-CM

## 2021-11-15 DIAGNOSIS — F32A Depression, unspecified: Secondary | ICD-10-CM

## 2021-11-15 DIAGNOSIS — O99280 Endocrine, nutritional and metabolic diseases complicating pregnancy, unspecified trimester: Secondary | ICD-10-CM

## 2021-11-15 DIAGNOSIS — E0590 Thyrotoxicosis, unspecified without thyrotoxic crisis or storm: Secondary | ICD-10-CM

## 2021-11-15 NOTE — Patient Instructions (Signed)
Second Trimester of Pregnancy ?The second trimester of pregnancy is from week 13 through week 27. This is months 4 through 6 of pregnancy. The second trimester is often a time when you feel your best. Your body has adjusted to being pregnant, and you begin to feel better physically. ?During the second trimester: ?Morning sickness has lessened or stopped completely. ?You may have more energy. ?You may have an increase in appetite. ?The second trimester is also a time when the unborn baby (fetus) is growing rapidly. At the end of the sixth month, the fetus may be up to 12 inches long and weigh about 1? pounds. You will likely begin to feel the baby move (quickening) between 16 and 20 weeks of pregnancy. ?Body changes during your second trimester ?Your body continues to go through many changes during your second trimester. The changes vary and generally return to normal after the baby is born. ?Physical changes ?Your weight will continue to increase. You will notice your lower abdomen bulging out. ?You may begin to get stretch marks on your hips, abdomen, and breasts. ?Your breasts will continue to grow and to become tender. ?Dark spots or blotches (chloasma or mask of pregnancy) may develop on your face. ?A dark line from your belly button to the pubic area (linea nigra) may appear. ?You may have changes in your hair. These can include thickening of your hair, rapid growth, and changes in texture. Some people also have hair loss during or after pregnancy, or hair that feels dry or thin. ?Health changes ?You may develop headaches. ?You may have heartburn. ?You may develop constipation. ?You may develop hemorrhoids or swollen, bulging veins (varicose veins). ?Your gums may bleed and may be sensitive to brushing and flossing. ?You may urinate more often because the fetus is pressing on your bladder. ?You may have back pain. This is caused by: ?Weight gain. ?Pregnancy hormones that are relaxing the joints in your  pelvis. ?A shift in weight and the muscles that support your balance. ?Follow these instructions at home: ?Medicines ?Follow your health care provider's instructions regarding medicine use. Specific medicines may be either safe or unsafe to take during pregnancy. Do not take any medicines unless approved by your health care provider. ?Take a prenatal vitamin that contains at least 600 micrograms (mcg) of folic acid. ?Eating and drinking ?Eat a healthy diet that includes fresh fruits and vegetables, whole grains, good sources of protein such as meat, eggs, or tofu, and low-fat dairy products. ?Avoid raw meat and unpasteurized juice, milk, and cheese. These carry germs that can harm you and your baby. ?You may need to take these actions to prevent or treat constipation: ?Drink enough fluid to keep your urine pale yellow. ?Eat foods that are high in fiber, such as beans, whole grains, and fresh fruits and vegetables. ?Limit foods that are high in fat and processed sugars, such as fried or sweet foods. ?Activity ?Exercise only as directed by your health care provider. Most people can continue their usual exercise routine during pregnancy. Try to exercise for 30 minutes at least 5 days a week. Stop exercising if you develop contractions in your uterus. ?Stop exercising if you develop pain or cramping in the lower abdomen or lower back. ?Avoid exercising if it is very hot or humid or if you are at a high altitude. ?Avoid heavy lifting. ?If you choose to, you may have sex unless your health care provider tells you not to. ?Relieving pain and discomfort ?Wear a supportive bra   to prevent discomfort from breast tenderness. ?Take warm sitz baths to soothe any pain or discomfort caused by hemorrhoids. Use hemorrhoid cream if your health care provider approves. ?Rest with your legs raised (elevated) if you have leg cramps or low back pain. ?If you develop varicose veins: ?Wear support hose as told by your health care  provider. ?Elevate your feet for 15 minutes, 3-4 times a day. ?Limit salt in your diet. ?Safety ?Wear your seat belt at all times when driving or riding in a car. ?Talk with your health care provider if someone is verbally or physically abusive to you. ?Lifestyle ?Do not use hot tubs, steam rooms, or saunas. ?Do not douche. Do not use tampons or scented sanitary pads. ?Avoid cat litter boxes and soil used by cats. These carry germs that can cause birth defects in the baby and possibly loss of the fetus by miscarriage or stillbirth. ?Do not use herbal remedies, alcohol, illegal drugs, or medicines that are not approved by your health care provider. Chemicals in these products can harm your baby. ?Do not use any products that contain nicotine or tobacco, such as cigarettes, e-cigarettes, and chewing tobacco. If you need help quitting, ask your health care provider. ?General instructions ?During a routine prenatal visit, your health care provider will do a physical exam and other tests. He or she will also discuss your overall health. Keep all follow-up visits. This is important. ?Ask your health care provider for a referral to a local prenatal education class. ?Ask for help if you have counseling or nutritional needs during pregnancy. Your health care provider can offer advice or refer you to specialists for help with various needs. ?Where to find more information ?American Pregnancy Association: americanpregnancy.org ?American College of Obstetricians and Gynecologists: acog.org/en/Womens%20Health/Pregnancy ?Office on Women's Health: womenshealth.gov/pregnancy ?Contact a health care provider if you have: ?A headache that does not go away when you take medicine. ?Vision changes or you see spots in front of your eyes. ?Mild pelvic cramps, pelvic pressure, or nagging pain in the abdominal area. ?Persistent nausea, vomiting, or diarrhea. ?A bad-smelling vaginal discharge or foul-smelling urine. ?Pain when you  urinate. ?Sudden or extreme swelling of your face, hands, ankles, feet, or legs. ?A fever. ?Get help right away if you: ?Have fluid leaking from your vagina. ?Have spotting or bleeding from your vagina. ?Have severe abdominal cramping or pain. ?Have difficulty breathing. ?Have chest pain. ?Have fainting spells. ?Have not felt your baby move for the time period told by your health care provider. ?Have new or increased pain, swelling, or redness in an arm or leg. ?Summary ?The second trimester of pregnancy is from week 13 through week 27 (months 4 through 6). ?Do not use herbal remedies, alcohol, illegal drugs, or medicines that are not approved by your health care provider. Chemicals in these products can harm your baby. ?Exercise only as directed by your health care provider. Most people can continue their usual exercise routine during pregnancy. ?Keep all follow-up visits. This is important. ?This information is not intended to replace advice given to you by your health care provider. Make sure you discuss any questions you have with your health care provider. ?Document Revised: 01/14/2020 Document Reviewed: 11/20/2019 ?Elsevier Patient Education ? 2022 Elsevier Inc. ? ?Contraception Choices ?Contraception, also called birth control, refers to methods or devices that prevent pregnancy. ?Hormonal methods ?Contraceptive implant ?A contraceptive implant is a thin, plastic tube that contains a hormone that prevents pregnancy. It is different from an intrauterine device (IUD). It   is inserted into the upper part of the arm by a health care provider. Implants can be effective for up to 3 years. ?Progestin-only injections ?Progestin-only injections are injections of progestin, a synthetic form of the hormone progesterone. They are given every 3 months by a health care provider. ?Birth control pills ?Birth control pills are pills that contain hormones that prevent pregnancy. They must be taken once a day, preferably at the  same time each day. A prescription is needed to use this method of contraception. ?Birth control patch ?The birth control patch contains hormones that prevent pregnancy. It is placed on the skin and must be changed once a week for

## 2021-11-15 NOTE — Progress Notes (Signed)
?  ? ?Subjective:  ? ?Margaret Goodman is a 26 y.o. O1Y0737 at 38w0dby early ultrasound being seen today for her first obstetrical visit.  Her obstetrical history is significant for  prior hx of fetal abnormality, hyperthyroidism . Patient does intend to breast feed. Pregnancy history fully reviewed. ? ?Patient reports nausea. Taking multiple anti-emetics, they are controlling her symptoms.  ? ?HISTORY: ?OB History  ?Gravida Para Term Preterm AB Living  ?4 1 1  0 2 1  ?SAB IAB Ectopic Multiple Live Births  ?0 2 0 0 1  ?  ?# Outcome Date GA Lbr Len/2nd Weight Sex Delivery Anes PTL Lv  ?4 Current           ?3 IAB 2022       N   ?   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   Name: EVANASOFIA, MICALLEF?   Apgar1: 7  Apgar5: 9  ?  ? ?Last pap smear: ?Lab Results  ?Component Value Date  ? DIAGPAP  06/10/2019  ?  - Negative for intraepithelial lesion or malignancy (NILM)  ? 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 ?Due for repeat in 05/2022 ? ?Past Medical History:  ?Diagnosis Date  ? Depression   ? dr had conversation today about therapy before medicine  ? Hyperlipidemia   ? Hyperthyroidism   ? Hypotension   ? UTI (urinary tract infection)   ? Vaginal Pap smear, abnormal   ? ok since  ? ?Past Surgical History:  ?Procedure Laterality Date  ? NO PAST SURGERIES    ? ?Family History  ?Problem Relation Age of Onset  ? Healthy Mother   ? Healthy Father   ? Thyroid disease Neg Hx   ? ?Social History  ? ?Tobacco Use  ? Smoking status: Never  ? Smokeless tobacco: Never  ?Vaping Use  ? Vaping Use: Never used  ?Substance Use Topics  ? Alcohol use: Not Currently  ?  Comment: ocassionally  ? Drug use: No  ? ?Allergies  ?Allergen Reactions  ? Tamiflu [Oseltamivir Phosphate] Hives  ? ?Current Outpatient Medications on File Prior to Visit  ?Medication Sig Dispense Refill  ? Blood Pressure Monitoring (BLOOD PRESSURE KIT) DEVI 1  each by Does not apply route once a week. 1 each 0  ? famotidine (PEPCID) 20 MG tablet Take 20 mg by mouth 2 (two) times daily.    ? metoCLOPramide (REGLAN) 10 MG tablet Take 1 tablet (10 mg total) by mouth every 6 (six) hours. 30 tablet 3  ? ondansetron (ZOFRAN ODT) 4 MG disintegrating tablet Take 1 tablet (4 mg total) by mouth every 8 (eight) hours as needed for nausea or vomiting. 20 tablet 11  ? polyethylene glycol (MIRALAX / GLYCOLAX) 17 g packet Take 17 g by mouth daily.    ? Prenatal Vit-Fe Fumarate-FA (MULTIVITAMIN-PRENATAL) 27-0.8 MG TABS tablet Take 1 tablet by mouth daily at 12 noon.    ? prochlorperazine (COMPAZINE) 10 MG tablet Take 1 tablet (10 mg total) by mouth 2 (two) times daily as needed for nausea or vomiting. 30 tablet 3  ? propylthiouracil (PTU) 50 MG tablet Take 1 tablet (50 mg total) by mouth daily. 90 tablet 1  ? scopolamine (TRANSDERM-SCOP) 1 MG/3DAYS Place 1 patch (1.5 mg total) onto the skin every 3 (three)  days. 10 patch 12  ? ?No current facility-administered medications on file prior to visit.  ? ? ? ?Exam  ? ?Vitals:  ? 11/15/21 1028  ?BP: 124/80  ?Pulse: 99  ?Weight: 192 lb (87.1 kg)  ? ?Fetal Heart Rate (bpm): 154 ? ?System: General: well-developed, well-nourished female in no acute distress  ? Skin: normal coloration and turgor, no rashes  ? Neurologic: oriented, normal, negative, normal mood  ? Extremities: normal strength, tone, and muscle mass, ROM of all joints is normal  ? HEENT PERRLA, extraocular movement intact and sclera clear, anicteric  ? Neck supple and no masses  ? Respiratory:  no respiratory distress  ? ? ?  ?Assessment:  ? ?Pregnancy: [id number] ?Patient Active Problem List  ? Diagnosis Date Noted  ? 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 Family historic risk of congenital abnormality 11/07/2021  ? Supervision of other normal pregnancy, antepartum 11/01/2021  ? Nausea and vomiting during pregnancy 05/05/2021  ? Hyperthyroidism in pregnancy, antepartum 05/02/2021  ? Vitamin D  deficiency 11/19/2017  ? ?  ?Plan:  ?1. Supervision of other normal pregnancy, antepartum ?BP and FHR normal ?Initial labs drawn. ?Continue prenatal vitamins. ?Genetic Screening discussed, NIPS: ordered. ?Ultrasound discussed; fetal anatomic survey: ordered. ?Problem list reviewed and updated. ?Will be engaged in Centering pregnancy.  ? ?2. Anxiety and depression ?Endorses increased symptoms ?Interested in speaking with Roselyn Reef, referral placed ?Declines meds at this time ? ?3. Hyperthyroidism in pregnancy, antepartum ?Saw Endo earlier this month, they are managing ?On PTU 50m daily ? ?4. 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 ? ?Routine obstetric precautions reviewed. ?[facility] (on [facility]13/2021[facility] ? ?  ? ?

## 2021-11-16 ENCOUNTER — Encounter: Payer: Self-pay | Admitting: *Deleted

## 2021-11-16 LAB — CBC/D/PLT+RPR+RH+ABO+RUBIGG...
Antibody Screen: NEGATIVE
Basophils Absolute: 0 10*3/uL (ref 0.0–0.2)
Basos: 0 %
EOS (ABSOLUTE): 0 10*3/uL (ref 0.0–0.4)
Eos: 1 %
HCV Ab: NONREACTIVE
HIV Screen 4th Generation wRfx: NONREACTIVE
Hematocrit: 36.8 % (ref 34.0–46.6)
Hemoglobin: 12.8 g/dL (ref 11.1–15.9)
Hepatitis B Surface Ag: NEGATIVE
Immature Grans (Abs): 0 10*3/uL (ref 0.0–0.1)
Immature Granulocytes: 0 %
Lymphocytes Absolute: 1.6 10*3/uL (ref 0.7–3.1)
Lymphs: 30 %
MCH: 31.5 pg (ref 26.6–33.0)
MCHC: 34.8 g/dL (ref 31.5–35.7)
MCV: 91 fL (ref 79–97)
Monocytes Absolute: 0.4 10*3/uL (ref 0.1–0.9)
Monocytes: 7 %
Neutrophils Absolute: 3.3 10*3/uL (ref 1.4–7.0)
Neutrophils: 62 %
Platelets: 244 10*3/uL (ref 150–450)
RBC: 4.06 x10E6/uL (ref 3.77–5.28)
RDW: 12.8 % (ref 11.7–15.4)
RPR Ser Ql: NONREACTIVE
Rh Factor: POSITIVE
Rubella Antibodies, IGG: 2.62 index (ref >0.99)
WBC: 5.4 10*3/uL (ref 3.4–10.8)

## 2021-11-16 LAB — HCV INTERPRETATION

## 2021-11-16 LAB — HEMOGLOBIN A1C
Est. average glucose Bld gHb Est-mCnc: 100 mg/dL
Hgb A1c MFr Bld: 5.1 % (ref 4.8–5.6)

## 2021-11-17 LAB — CULTURE, OB URINE

## 2021-11-17 LAB — URINE CULTURE, OB REFLEX

## 2021-11-17 NOTE — BH Specialist Note (Deleted)
Integrated Behavioral Health via Telemedicine Visit ? ?11/17/2021 ?Ferrah Panagopoulos ?[id number] ? ?Number of Integrated Behavioral Health Clinician visits: No data recorded ?Session Start time: No data recorded  ?Session End time: No data recorded ?Total time in minutes: No data recorded ? ?Referring Provider: *** ?Patient/Family location: *** ?[facility] Provider location: *** ?All persons participating in visit: *** ?Types of Service: {CHL AMB TYPE OF SERVICE:[phone]} ? ?I connected with Margaret Goodman and/or Margaret Goodman's {family members:20773} via  Telephone or Engineer, civil (consulting)  (Video is Caregility application) and verified that I am speaking with the correct person using two identifiers. Discussed confidentiality: {YES/NO:21197} ? ?I discussed the limitations of telemedicine and the availability of in person appointments.  Discussed there is a possibility of technology failure and discussed alternative modes of communication if that failure occurs. ? ?I discussed that engaging in this telemedicine visit, they consent to the provision of behavioral healthcare and the services will be billed under their insurance. ? ?Patient and/or legal guardian expressed understanding and consented to Telemedicine visit: {YES/NO:21197} ? ?Presenting Concerns: ?Patient and/or family reports the following symptoms/concerns: *** ?Duration of problem: ***; Severity of problem: {Mild/Moderate/Severe:20260} ? ?Patient and/or Family's Strengths/Protective Factors: ?{CHL AMB BH PROTECTIVE FACTORS:[phone]} ? ?Goals Addressed: ?Patient will: ? Reduce symptoms of: {IBH Symptoms:21014056}  ? Increase knowledge and/or ability of: {IBH Patient Tools:21014057}  ? Demonstrate ability to: {IBH Goals:21014053} ? ?Progress towards Goals: ?{CHL AMB BH PROGRESS TOWARDS ONGEX:[id number]} ? ?Interventions: ?Interventions utilized:  {IBH Interventions:21014054} ?Standardized Assessments completed: {IBH Screening  Tools:21014051} ? ?Patient and/or Family Response: *** ? ?Assessment: ?Patient currently experiencing ***.  ? ?Patient may benefit from ***. ? ?Plan: ?Follow up with behavioral health clinician on : *** ?Behavioral recommendations: *** ?Referral(s): {IBH Referrals:21014055} ? ?I discussed the assessment and treatment plan with the patient and/or parent/guardian. They were provided an opportunity to ask questions and all were answered. They agreed with the plan and demonstrated an understanding of the instructions. ?  ?They were advised to call back or seek an in-person evaluation if the symptoms worsen or if the condition fails to improve as anticipated. ? ?Rae Lips, LCSW ?

## 2021-11-29 NOTE — BH Specialist Note (Signed)
Integrated Behavioral Health via Telemedicine Visit ? ?12/01/2021 ?Kynlei Piontek ?[id number] ? ?Number of Integrated Behavioral Health Clinician visits: 1- Initial Visit ? ?Session Start time: 1546 ?  ?Session End time: 1612 ? ?Total time in minutes: 26 ? ? ?Referring Provider: Dorathy Kinsman, CNM ?Patient/Family location: Home ?[facility] Provider location: [facility] [employer] [facility] ? ?All persons participating in visit: Patient Margaret Goodman and [facility] Licet Dunphy  ? ?Types of Service: Individual psychotherapy and Video visit ? ?I connected with Melony Overly and/or Monteen Epler's  n/a  via  Telephone or [employer]  (Video is Caregility application) and verified that I am speaking with the correct person using two identifiers. Discussed confidentiality: Yes  ? ?I discussed the limitations of telemedicine and the availability of in person appointments.  Discussed there is a possibility of technology failure and discussed alternative modes of communication if that failure occurs. ? ?I discussed that engaging in this telemedicine visit, they consent to the provision of behavioral healthcare and the services will be billed under their insurance. ? ?Patient and/or legal guardian expressed understanding and consented to Telemedicine visit: Yes  ? ?Presenting Concerns: ?Patient and/or family reports the following symptoms/concerns: Anxious, fatigue, irritability, sleep difficulty, attributed to worry after last pregnancy loss.  ?Duration of problem: Current pregnancy; Severity of problem: mild ? ?Patient and/or Family's Strengths/Protective Factors: ?Social connections, Social and Patent attorney, Concrete supports in place (healthy food, safe environments, etc.), Sense of purpose, and Physical Health (exercise, healthy diet, medication compliance, etc.) ? ?Goals Addressed: ?Patient will: ? Reduce symptoms of: anxiety  ? Increase knowledge and/or ability  of: healthy habits  ? Demonstrate ability to: Increase healthy adjustment to current life circumstances ? ?Progress towards Goals: ?Ongoing ? ?Interventions: ?Interventions utilized:  Sleep Hygiene, Psychoeducation and/or Health Education, Link to [employer], and Supportive Reflection ?Standardized Assessments completed:  PHQ9/GAD7 given within two weeks ? ?Patient and/or Family Response: Patient agrees with treatment plan. ? ? ?Assessment: ?Patient currently experiencing Adjustment disorder with anxiety.  ? ?Patient may benefit from psychoeducation and brief therapeutic interventions regarding coping with symptoms of anxiety ?. ? ?Plan: ?Follow up with behavioral health clinician on : One month ?Behavioral recommendations:  ?-Continue taking prenatal vitamin as prescribed ?-Prioritize healthy self-care daily for the next month (healthy sleep and meals; time for self) ?-View resources on After Visit Summary; use as needed ?Referral(s): Integrated [employer] (In Clinic) and [employer]:  new parent support ? ?I discussed the assessment and treatment plan with the patient and/or parent/guardian. They were provided an opportunity to ask questions and all were answered. They agreed with the plan and demonstrated an understanding of the instructions. ?  ?They were advised to call back or seek an in-person evaluation if the symptoms worsen or if the condition fails to improve as anticipated. ? ?Rae Lips, LCSW ? ? ?  11/15/2021  ? 10:26 AM 11/08/2021  ?  2:07 PM 05/27/2021  ? 11:26 AM  ?Depression screen PHQ 2/9  ?Decreased Interest 1 1 3   ?Down, Depressed, Hopeless 1 1 3   ?PHQ - 2 Score 2 2 6   ?Altered sleeping 1 1 2   ?Tired, decreased energy 2 1 2   ?Change in appetite 1 1 2   ?Feeling bad or failure about yourself  0 0 3  ?Trouble concentrating 0 1 3  ?Moving slowly or fidgety/restless 0 0 3  ?Suicidal thoughts 0 0 0  ?PHQ-9 Score 6 6 21   ?Difficult doing  work/chores Not difficult at  all    ? ? ?  11/15/2021  ? 10:27 AM 11/08/2021  ?  2:07 PM 05/27/2021  ? 11:27 AM  ?GAD 7 : Generalized Anxiety Score  ?Nervous, Anxious, on Edge 1 1 1   ?Control/stop worrying 0 0 2  ?Worry too much - different things 0 0 1  ?Trouble relaxing 1 1 3   ?Restless 0 0 2  ?Easily annoyed or irritable 3 1 2   ?Afraid - awful might happen 1 1 1   ?Total GAD 7 Score 6 4 12   ?Anxiety Difficulty Not difficult at all    ? ? ? ?

## 2021-12-01 ENCOUNTER — Ambulatory Visit (INDEPENDENT_AMBULATORY_CARE_PROVIDER_SITE_OTHER): Payer: 59 | Admitting: Clinical

## 2021-12-01 DIAGNOSIS — F4322 Adjustment disorder with anxiety: Secondary | ICD-10-CM | POA: Diagnosis not present

## 2021-12-01 NOTE — Patient Instructions (Addendum)
Center for [facility] at [facility] ?[address] ?[place], [place] [address] ?[phone] (main office) ?[phone] (Kareen Hitsman's office) ? ?[email or web address]  ? ?www.[email or web address].net (new parent support groups) ? ?/Emotional Producer, television/film/video and Websites ?Here are a few free apps meant to help you to help yourself.  To find, try searching on the internet to see if the app is offered on Apple/Android devices. If your first choice doesn't come up on your device, the good news is that there are many choices! Play around with different apps to see which ones are helpful to you. ? ? ? Calm ?This is an app meant to help increase calm feelings. Includes info, strategies, and tools for tracking your feelings. ?  ? ? ? Calm Harm  ?This app is meant to help with self-harm. Provides many 5-minute or 15-min coping strategies for doing instead of hurting yourself.  ?  ? ? ? Healthy Minds ?Health Minds is a problem-solving tool to help deal with emotions and cope with stress you encounter wherever you are. ?  ? ? ? MindShift ?This app can help people cope with anxiety. Rather than trying to avoid anxiety, you can make an important shift and face it. ?  ? ? ? MY3  ?MY3 features a support system, safety plan and resources with the goal of offering a tool to use in a time of need. ? ?  ? ? ? My Life My Voice  ?This mood journal offers a simple solution for tracking your thoughts, feelings and moods. Animated emoticons can help identify your mood. ? ?  ? ? ? Relax Melodies ?Designed to help with sleep, on this app you can mix sounds and meditations for relaxation. ?  ? ? ? Smiling Mind ?Smiling Mind is meditation made easy: it's a simple tool that helps put a smile on your mind. ?  ? ? ? ? ? Stop, Breathe & Think  ?A friendly, simple guide for people through meditations for mindfulness and compassion. ? ?Stop, Breathe and Think Kids ?Enter your current feelings and choose a ?mission? to help you cope.  Offers videos for certain moods instead of just sound recordings.  ?  ? ? ? Team Orange ?The goal of this tool is to help teens change how they think, act, and react. This app helps you focus on your own good feelings and experiences. ?  ? ? ? The [employer] Box ?The [employer] Station[employer] Box (VHB) contains simple tools to help patients with coping, relaxation, distraction, and positive thinking. ?  ? ? ?

## 2021-12-05 ENCOUNTER — Other Ambulatory Visit: Payer: Self-pay | Admitting: Advanced Practice Midwife

## 2021-12-05 DIAGNOSIS — Z368A Encounter for antenatal screening for other genetic defects: Secondary | ICD-10-CM

## 2021-12-05 DIAGNOSIS — O09892 Supervision of other high risk pregnancies, second trimester: Secondary | ICD-10-CM

## 2021-12-05 DIAGNOSIS — Z3A14 14 weeks gestation of pregnancy: Secondary | ICD-10-CM

## 2021-12-05 DIAGNOSIS — O358XX0 Maternal care for other (suspected) fetal abnormality and damage, not applicable or unspecified: Secondary | ICD-10-CM

## 2021-12-06 ENCOUNTER — Encounter: Payer: 59 | Admitting: Advanced Practice Midwife

## 2021-12-14 ENCOUNTER — Encounter: Payer: Self-pay | Admitting: Advanced Practice Midwife

## 2021-12-14 ENCOUNTER — Ambulatory Visit (INDEPENDENT_AMBULATORY_CARE_PROVIDER_SITE_OTHER): Payer: 59 | Admitting: Advanced Practice Midwife

## 2021-12-14 VITALS — BP 106/59 | HR 70 | Wt 192.2 lb

## 2021-12-14 DIAGNOSIS — O358XX0 Maternal care for other (suspected) fetal abnormality and damage, not applicable or unspecified: Secondary | ICD-10-CM

## 2021-12-14 DIAGNOSIS — E0590 Thyrotoxicosis, unspecified without thyrotoxic crisis or storm: Secondary | ICD-10-CM

## 2021-12-14 DIAGNOSIS — Z3480 Encounter for supervision of other normal pregnancy, unspecified trimester: Secondary | ICD-10-CM

## 2021-12-14 DIAGNOSIS — Z3A16 16 weeks gestation of pregnancy: Secondary | ICD-10-CM

## 2021-12-14 DIAGNOSIS — O99280 Endocrine, nutritional and metabolic diseases complicating pregnancy, unspecified trimester: Secondary | ICD-10-CM

## 2021-12-14 NOTE — Progress Notes (Signed)
Pt states having some soreness and discomfort in lower abdomen. ?

## 2021-12-14 NOTE — Progress Notes (Signed)
? ?PRENATAL VISIT NOTE ? ?Subjective:  ?Margaret Goodman is a 26 y.o. G4P1021 at [redacted]w[redacted]d being seen today for ongoing prenatal care.  She is currently monitored for the following issues for this high-risk pregnancy and has Vitamin D deficiency; Hyperthyroidism in pregnancy, antepartum; Nausea and vomiting during pregnancy; Supervision of other normal pregnancy, antepartum; Family historic risk of congenital abnormality; and HPV in female on their problem list. ? ?Patient reports  abdominal soreness and occasional sharp pains that improve w/ heat and massage .  Contractions: Not present. Vag. Bleeding: None.  Movement: Absent. Denies leaking of fluid. Denies dysuria, urgency, hematuria, vaginal discharge, vaginal bleeding, N/V/D/C. Reports urinary frequency.  ? ?The following portions of the patient's history were reviewed and updated as appropriate: allergies, current medications, past family history, past medical history, past social history, past surgical history and problem list.  ? ?She is in CenteringPregnancy group 4 but missed last visit. Plans to continue.  ? ?Hyperthyroidism: On PTU 50 mg PO QD. Per Dr. Lissa Merlin. Taking as direct but feels bad on medicine (nausea, fatigue). States she felt better on Methimazole in the past. Rare episodes of palpitations.  ? ?Objective:  ? ?Vitals:  ? 12/14/21 1320  ?BP: (!) 106/59  ?Pulse: 70  ?Weight: 192 lb 3.2 oz (87.2 kg)  ? ? ?Fetal Status: Fetal Heart Rate (bpm): 141   Movement: Absent    ? ?General:  Alert, oriented and cooperative. Patient is in no acute distress.  ?Skin: Skin is warm and dry. No rash noted.   ?Cardiovascular: Normal heart rate noted  ?Respiratory: Normal respiratory effort, no problems with respiration noted  ?Abdomen: Soft, gravid, appropriate for gestational age.  Pain/Pressure: Present     ?Pelvic: Cervical exam deferred     Declined  ?Extremities: Normal range of motion.  Edema: None  ?Mental Status: Normal mood and affect. Normal behavior. Normal  judgment and thought content.  ? ?Assessment and Plan:  ?Pregnancy: [id number] at [redacted]w[redacted]d ?1. Hyperthyroidism in pregnancy, antepartum ?- Appt w/ Dr. Adrian Prince, Endo 12/23/21 ?- On PTU 50 mg PO QD. Per Dr. Lissa Merlin. Will discuss switching to Methimazole if pt needs to stay on meds. Will decide after next TFTs.  ? ?2. Supervision of other normal pregnancy, antepartum ?- Second trimester precautions.  ?- Comfort measures  ? ?3. Family or maternal historic risk of congenital anomaly, antepartum, single or unspecified fetus ?- MFM referral to discuss possibility of repeat occurrence 12/28/21 ?- Referral placed for genetics counselor 12/28/21 ? ?4. [redacted] weeks gestation of pregnancy ? ?5. Abdominal Pain in Pregnancy ?- UA ?- Comfort measures ?- Precautions, when to call office or come to MAU.  ? ?Preterm labor symptoms and general obstetric precautions including but not limited to vaginal bleeding, contractions, leaking of fluid and fetal movement were reviewed in detail with the patient. ?Please refer to After Visit Summary for other counseling recommendations.  ? ?No follow-ups on file. ? ?Future Appointments  ?Date Time Provider [place]  ?12/23/2021  9:30 AM Renato Shin, MD [facility] None  ?12/28/2021  9:15 AM [facility] NURSE [facility]  ?12/28/2021  9:30 AM [facility] US2 [facility]  ?12/28/2021 10:30 AM [facility] GENETIC COUNSELING RM [facility]  ?01/03/2022  9:00 AM CENTERING PROVIDER [facility] [place]  ?01/31/2022  9:00 AM CENTERING PROVIDER [facility] [place]  ?02/14/2022  9:00 AM CENTERING PROVIDER [facility] [place]  ?02/28/2022  9:00 AM CENTERING PROVIDER [facility] [place]  ?03/14/2022  9:00 AM CENTERING PROVIDER [facility] [place]  ?03/28/2022  9:00 AM CENTERING PROVIDER [facility] [place]  ?  04/11/2022  9:00 AM CENTERING PROVIDER [facility] [place]  ?04/25/2022  9:00 AM CENTERING PROVIDER [facility] [place]  ? ? ?Manya Silvas, CNM ?

## 2021-12-15 LAB — POCT URINALYSIS DIP (DEVICE)
Bilirubin Urine: NEGATIVE
Glucose, UA: NEGATIVE mg/dL
Hgb urine dipstick: NEGATIVE
Ketones, ur: 40 mg/dL — AB
Leukocytes,Ua: NEGATIVE
Nitrite: NEGATIVE
Protein, ur: 30 mg/dL — AB
Specific Gravity, Urine: 1.02 (ref 1.005–1.030)
Urobilinogen, UA: 0.2 mg/dL (ref 0.0–1.0)
pH: 7 (ref 5.0–8.0)

## 2021-12-16 LAB — AFP, SERUM, OPEN SPINA BIFIDA
AFP MoM: 1.04
AFP Value: 34 ng/mL
Gest. Age on Collection Date: 16 weeks
Maternal Age At EDD: 26.6 yr
OSBR Risk 1 IN: 10000
Test Results:: NEGATIVE
Weight: 192 [lb_av]

## 2021-12-23 ENCOUNTER — Ambulatory Visit (INDEPENDENT_AMBULATORY_CARE_PROVIDER_SITE_OTHER): Payer: 59 | Admitting: Endocrinology

## 2021-12-23 ENCOUNTER — Encounter: Payer: Self-pay | Admitting: Endocrinology

## 2021-12-23 VITALS — BP 110/64 | Ht 67.0 in | Wt 196.0 lb

## 2021-12-23 DIAGNOSIS — O99280 Endocrine, nutritional and metabolic diseases complicating pregnancy, unspecified trimester: Secondary | ICD-10-CM

## 2021-12-23 DIAGNOSIS — E0590 Thyrotoxicosis, unspecified without thyrotoxic crisis or storm: Secondary | ICD-10-CM | POA: Diagnosis not present

## 2021-12-23 LAB — TSH: TSH: 0.71 u[IU]/mL (ref 0.35–5.50)

## 2021-12-23 LAB — T4, FREE: Free T4: 0.65 ng/dL (ref 0.60–1.60)

## 2021-12-23 MED ORDER — METHIMAZOLE 5 MG PO TABS: 5.0000 mg | ORAL_TABLET | ORAL | 1 refills | Status: DC

## 2021-12-23 NOTE — Patient Instructions (Addendum)
Blood tests are requested for you today.  We'll let you know about the results.   ?If ever you have fever while taking propylthiouracil, stop it and call us, even if the reason is obvious, because of the risk of a rare side-effect.   ?It is best to never miss this medication.  However, if you do miss it, next best is to double up the next time.   ?You should have an endocrinology follow-up appointment in 6 weeks.   ?

## 2021-12-23 NOTE — Progress Notes (Signed)
? ?Subjective:  ? ? Patient ID: Margaret Goodman, female    DOB: 31-Dec-1995, 26 y.o.   MRN: [id number] ? ?HPI ?Pt returns for f/u of hyperthyroidism (dx'ed 2022; she took tapazole x a few mos in 2022; she has never had thyroid imaging; during current pregnancy, PTU was rx;ed, due to threatened miscarriage).  She is not [redacted] weeks pregnant.  Since on PTU, pt states she feels well in general.   ?Past Medical History:  ?Diagnosis Date  ? Depression   ? dr had conversation today about therapy before medicine  ? Hyperlipidemia   ? Hyperthyroidism   ? Hypotension   ? UTI (urinary tract infection)   ? Vaginal Pap smear, abnormal   ? ok since  ? ? ?Past Surgical History:  ?Procedure Laterality Date  ? NO PAST SURGERIES    ? ? ?Social History  ? ?Socioeconomic History  ? Marital status: Single  ?  Spouse name: Not on file  ? Number of children: Not on file  ? Years of education: Not on file  ? Highest education level: Not on file  ?Occupational History  ? Not on file  ?Tobacco Use  ? Smoking status: Never  ? Smokeless tobacco: Never  ?Vaping Use  ? Vaping Use: Never used  ?Substance and Sexual Activity  ? Alcohol use: Not Currently  ?  Comment: ocassionally  ? Drug use: No  ? Sexual activity: Yes  ?  Birth control/protection: None  ?Other Topics Concern  ? Not on file  ?Social History Narrative  ? Not on file  ? ?Social Determinants of Health  ? ?Financial Resource Strain: Not on file  ?Food Insecurity: Not on file  ?Transportation Needs: Not on file  ?Physical Activity: Not on file  ?Stress: Not on file  ?Social Connections: Not on file  ?Intimate Partner Violence: Not on file  ? ? ?Current Outpatient Medications on File Prior to Visit  ?Medication Sig Dispense Refill  ? Blood Pressure Monitoring (BLOOD PRESSURE KIT) DEVI 1 each by Does not apply route once a week. 1 each 0  ? famotidine (PEPCID) 20 MG tablet Take 20 mg by mouth 2 (two) times daily.    ? metoCLOPramide (REGLAN) 10 MG tablet Take 1 tablet (10 mg total) by mouth  every 6 (six) hours. 30 tablet 3  ? ondansetron (ZOFRAN ODT) 4 MG disintegrating tablet Take 1 tablet (4 mg total) by mouth every 8 (eight) hours as needed for nausea or vomiting. 20 tablet 11  ? polyethylene glycol (MIRALAX / GLYCOLAX) 17 g packet Take 17 g by mouth daily.    ? Prenatal Vit-Fe Fumarate-FA (MULTIVITAMIN-PRENATAL) 27-0.8 MG TABS tablet Take 1 tablet by mouth daily at 12 noon.    ? prochlorperazine (COMPAZINE) 10 MG tablet Take 1 tablet (10 mg total) by mouth 2 (two) times daily as needed for nausea or vomiting. 30 tablet 3  ? scopolamine (TRANSDERM-SCOP) 1 MG/3DAYS Place 1 patch (1.5 mg total) onto the skin every 3 (three) days. 10 patch 12  ? ?No current facility-administered medications on file prior to visit.  ? ? ?Allergies  ?Allergen Reactions  ? Tamiflu [Oseltamivir Phosphate] Hives  ? ? ?Family History  ?Problem Relation Age of Onset  ? Healthy Mother   ? Healthy Father   ? Thyroid disease Neg Hx   ? ? ?BP 110/64   Ht $R'5\' 7"'mg$  (1.702 m)   Wt 196 lb (88.9 kg)   LMP 08/17/2021   BMI 30.70 kg/m?  ? ? ?  Review of Systems ?Denies fever. ?   ?Objective:  ? Physical Exam ?VITAL SIGNS:  See vs page ?GENERAL: no distress.   ?NECK: thyroid is slightly and diffusely enlarged.   ?SKIN: not diaphoretic. ?NEURO: no tremor.   ? ?Lab Results  ?Component Value Date  ? TSH 0.71 12/23/2021  ? T3TOTAL 129 05/27/2021  ? ?   ?Assessment & Plan:  ?Hyperthyroidism: well-controlled.  I have sent a prescription to your pharmacy, to change to tapazole.  ? ?

## 2021-12-28 ENCOUNTER — Ambulatory Visit: Payer: 59 | Admitting: *Deleted

## 2021-12-28 ENCOUNTER — Inpatient Hospital Stay (HOSPITAL_COMMUNITY)
Admission: AD | Admit: 2021-12-28 | Discharge: 2021-12-29 | Discharge status: Against Medical Advice | Payer: 59 | Attending: Obstetrics and Gynecology | Admitting: Obstetrics and Gynecology

## 2021-12-28 ENCOUNTER — Ambulatory Visit: Payer: 59

## 2021-12-28 ENCOUNTER — Other Ambulatory Visit: Payer: Self-pay

## 2021-12-28 ENCOUNTER — Ambulatory Visit (HOSPITAL_BASED_OUTPATIENT_CLINIC_OR_DEPARTMENT_OTHER): Payer: 59

## 2021-12-28 ENCOUNTER — Other Ambulatory Visit: Payer: Self-pay | Admitting: *Deleted

## 2021-12-28 ENCOUNTER — Encounter: Payer: Self-pay | Admitting: *Deleted

## 2021-12-28 ENCOUNTER — Ambulatory Visit (HOSPITAL_BASED_OUTPATIENT_CLINIC_OR_DEPARTMENT_OTHER): Payer: 59 | Admitting: Obstetrics

## 2021-12-28 VITALS — BP 121/63 | HR 70

## 2021-12-28 DIAGNOSIS — O09292 Supervision of pregnancy with other poor reproductive or obstetric history, second trimester: Secondary | ICD-10-CM

## 2021-12-28 DIAGNOSIS — Z6830 Body mass index (BMI) 30.0-30.9, adult: Secondary | ICD-10-CM | POA: Insufficient documentation

## 2021-12-28 DIAGNOSIS — Z3A18 18 weeks gestation of pregnancy: Secondary | ICD-10-CM

## 2021-12-28 DIAGNOSIS — O99282 Endocrine, nutritional and metabolic diseases complicating pregnancy, second trimester: Secondary | ICD-10-CM

## 2021-12-28 DIAGNOSIS — Z3480 Encounter for supervision of other normal pregnancy, unspecified trimester: Secondary | ICD-10-CM | POA: Insufficient documentation

## 2021-12-28 DIAGNOSIS — E0590 Thyrotoxicosis, unspecified without thyrotoxic crisis or storm: Secondary | ICD-10-CM | POA: Insufficient documentation

## 2021-12-28 DIAGNOSIS — Z363 Encounter for antenatal screening for malformations: Secondary | ICD-10-CM | POA: Insufficient documentation

## 2021-12-28 DIAGNOSIS — O99212 Obesity complicating pregnancy, second trimester: Secondary | ICD-10-CM

## 2021-12-28 DIAGNOSIS — O321XX0 Maternal care for breech presentation, not applicable or unspecified: Secondary | ICD-10-CM | POA: Diagnosis not present

## 2021-12-28 DIAGNOSIS — Z6832 Body mass index (BMI) 32.0-32.9, adult: Secondary | ICD-10-CM

## 2021-12-28 LAB — URINALYSIS, ROUTINE W REFLEX MICROSCOPIC
Bilirubin Urine: NEGATIVE
Glucose, UA: NEGATIVE mg/dL
Hgb urine dipstick: NEGATIVE
Ketones, ur: NEGATIVE mg/dL
Nitrite: NEGATIVE
Protein, ur: NEGATIVE mg/dL
Specific Gravity, Urine: 1.003 — ABNORMAL LOW (ref 1.005–1.030)
pH: 6 (ref 5.0–8.0)

## 2021-12-28 NOTE — Progress Notes (Signed)
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 After discussing results from today's ultrasound with Dr. Parke Poisson, [name] declined genetic counseling today. Genetic counseling remains available to [name] in the future if she decides she would like to have it. ? ?Thank you for sharing in the care of [name] with [place]. ?Please do not hesitate to contact us if you have any questions. ? ?Teena Dunk, MS, CGC ?Certified Genetic Counselor ? ?

## 2021-12-28 NOTE — Progress Notes (Signed)
MFM Note ? 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  The patient also has a history of hyperthyroidism that is currently treated with methimazole.  Her TSH and free T4 levels drawn 5 days ago were within normal limits. ? ?She denies any problems in her current pregnancy.   ? ?She had a cell free DNA test earlier in her pregnancy which indicated a low risk for trisomy 10, 40, and 13. A female fetus is predicted.  ? ?She was informed that the fetal growth and amniotic fluid level were appropriate for her gestational age.  ? ?There were no obvious fetal anomalies noted on today's ultrasound exam.  The patient and her partner were reassured by today's normal exam. ? ?The patient was informed that anomalies may be missed due to technical limitations. If the fetus is in a suboptimal position or maternal habitus is increased, visualization of the fetus in the maternal uterus may be impaired. ? ?Hyperthyroidism and pregnancy ? ?The patient was advised to continue taking methimazole for the duration of her pregnancy. ? ?She should have her thyroid function tests monitored at least once every trimester.   ? ?The dose of methimazole should be adjusted based on her thyroid function tests.   ? ?Weekly fetal testing should be started at around 32 weeks.   ? ?The patient was advised that maintaining normal thyroid function throughout her pregnancy will provide her with the most optimal obstetrical outcomes. ? ?We will continue to follow her closely with monthly growth ultrasounds. ? ?A follow-up exam was scheduled in 4 weeks to assess the fetal growth and to reassess the fetal anatomy.  ? ?The patient stated that all of her questions were answered today. ? ?A total of 20 minutes was spent counseling and coordinating the care for this patient.  Greater than 50% of the time was spent in direct  face-to-face contact. ?

## 2021-12-28 NOTE — MAU Note (Signed)
Margaret Goodman is a 26 y.o. at [redacted]w[redacted]d here in MAU reporting: was at work, had some stomach and back pain, "came and went a couple of times". Sharp/tightening- upper abd- around to back.no longer happening.  Denies bleeding.  ?Onset of complaint: around 1700 ?Pain score: 10 ?Vitals:  ? 12/28/21 1900  ?BP: 117/69  ?Pulse: 62  ?Resp: 17  ?Temp: 98.6 ?F (37 ?C)  ?SpO2: 100%  ?   ?FHT:140 ?Lab orders placed from triage:  urine ?

## 2022-01-03 ENCOUNTER — Encounter: Payer: 59 | Admitting: Advanced Practice Midwife

## 2022-01-10 ENCOUNTER — Encounter: Payer: Self-pay | Admitting: Advanced Practice Midwife

## 2022-01-10 MED ORDER — PANTOPRAZOLE SODIUM 40 MG PO TBEC: 40.0000 mg | DELAYED_RELEASE_TABLET | Freq: Every day | ORAL | 1 refills | Status: DC

## 2022-01-27 ENCOUNTER — Ambulatory Visit: Payer: 59 | Admitting: *Deleted

## 2022-01-27 ENCOUNTER — Other Ambulatory Visit: Payer: Self-pay | Admitting: *Deleted

## 2022-01-27 ENCOUNTER — Ambulatory Visit: Payer: 59 | Attending: Obstetrics

## 2022-01-27 VITALS — BP 113/60 | HR 65

## 2022-01-27 DIAGNOSIS — Z3A22 22 weeks gestation of pregnancy: Secondary | ICD-10-CM | POA: Diagnosis not present

## 2022-01-27 DIAGNOSIS — E669 Obesity, unspecified: Secondary | ICD-10-CM

## 2022-01-27 DIAGNOSIS — Z362 Encounter for other antenatal screening follow-up: Secondary | ICD-10-CM | POA: Insufficient documentation

## 2022-01-27 DIAGNOSIS — O99282 Endocrine, nutritional and metabolic diseases complicating pregnancy, second trimester: Secondary | ICD-10-CM | POA: Insufficient documentation

## 2022-01-27 DIAGNOSIS — E0590 Thyrotoxicosis, unspecified without thyrotoxic crisis or storm: Secondary | ICD-10-CM | POA: Insufficient documentation

## 2022-01-27 DIAGNOSIS — O321XX0 Maternal care for breech presentation, not applicable or unspecified: Secondary | ICD-10-CM | POA: Diagnosis not present

## 2022-01-27 DIAGNOSIS — O352XX0 Maternal care for (suspected) hereditary disease in fetus, not applicable or unspecified: Secondary | ICD-10-CM | POA: Diagnosis not present

## 2022-01-27 DIAGNOSIS — O99212 Obesity complicating pregnancy, second trimester: Secondary | ICD-10-CM | POA: Diagnosis not present

## 2022-01-27 DIAGNOSIS — Z3480 Encounter for supervision of other normal pregnancy, unspecified trimester: Secondary | ICD-10-CM

## 2022-01-30 ENCOUNTER — Telehealth: Payer: Self-pay

## 2022-01-30 NOTE — Telephone Encounter (Signed)
I attempted to call patient to review upcoming Centering Pregnancy Prenatal Care Appointment date and time. Patient did not attend last appointment and I wanted to verify if patient is interested in continuing in Centering Pregnancy and if she plans to attend next appointment. Patient did not answer phone call. Voicemail left asking patient to call us back or to check her mychart for message I sent her.   Paulina Fusi, RN 01/30/22

## 2022-01-31 ENCOUNTER — Encounter: Payer: 59 | Admitting: Advanced Practice Midwife

## 2022-02-07 ENCOUNTER — Encounter: Payer: Self-pay | Admitting: Internal Medicine

## 2022-02-07 ENCOUNTER — Ambulatory Visit (INDEPENDENT_AMBULATORY_CARE_PROVIDER_SITE_OTHER): Payer: 59 | Admitting: Internal Medicine

## 2022-02-07 VITALS — BP 114/72 | HR 76 | Ht 67.0 in | Wt 210.0 lb

## 2022-02-07 DIAGNOSIS — E0590 Thyrotoxicosis, unspecified without thyrotoxic crisis or storm: Secondary | ICD-10-CM | POA: Diagnosis not present

## 2022-02-07 DIAGNOSIS — O99280 Endocrine, nutritional and metabolic diseases complicating pregnancy, unspecified trimester: Secondary | ICD-10-CM

## 2022-02-07 LAB — TSH: TSH: 1.12 u[IU]/mL (ref 0.35–5.50)

## 2022-02-07 NOTE — Progress Notes (Unsigned)
Name: Margaret Goodman  MRN/ DOB: [id number], Nov 29, 1995    Age/ Sex: 26 y.o., female     PCP: Mckinley Jewel, MD   Reason for Endocrinology Evaluation: Hyperthyroidism     Initial Endocrinology Clinic Visit: 10/20/2021    PATIENT IDENTIFIER: Margaret Goodman is a 26 y.o., female with a past medical history of hyperthyroidism. She has followed with [facility] since 10/20/2021 for consultative assistance with management of her Hyperthyroidism.     HISTORICAL SUMMARY: The patient was first diagnosed with hyperthyroidism in 02/2021.  She was initially on methimazole but this was switched to PTU during pregnancy 09/2021 and was switched back to methimazole during her second trimester in May 2023 during her second pregnancy which ended up with miscarriage. Her TFT's normalized after that until her 3rd pregnancy   She has a 45 yr old child at home  No Fh of thyroid disease   SUBJECTIVE:    Today (02/07/2022):  Ms. Better is here for follow-up follow-up on hyperthyroidism , she is currently  24 weeks of gestation.   Denies weight loss  Has occasional nausea but no vomiting or diarrhea  Has occasional palpitations  Denies tremors  Denies eye symptoms   EDD 05/30/2022  She is on Methimazole 5 mg , 3 times a week  HISTORY:  Past Medical History:  Past Medical History:  Diagnosis Date   Depression    dr had conversation today about therapy before medicine   Hyperlipidemia    Hyperthyroidism    Hypotension    UTI (urinary tract infection)    Vaginal Pap smear, abnormal    ok since   Past Surgical History:  Past Surgical History:  Procedure Laterality Date   NO PAST SURGERIES     Social History:  reports that she has never smoked. She has never used smokeless tobacco. She reports that she does not currently use alcohol. She reports that she does not use drugs. Family History:  Family History  Problem Relation Age of Onset   Healthy Mother    Healthy Father     Thyroid disease Neg Hx      HOME MEDICATIONS: Allergies as of 02/07/2022       Reactions   Tamiflu [oseltamivir Phosphate] Hives        Medication List        Accurate as of February 07, 2022  8:24 AM. If you have any questions, ask your nurse or doctor.          Blood Pressure Kit Devi 1 each by Does not apply route once a week.   famotidine 20 MG tablet Commonly known as: PEPCID Take 20 mg by mouth 2 (two) times daily.   methimazole 5 MG tablet Commonly known as: TAPAZOLE Take 1 tablet (5 mg total) by mouth 3 (three) times a week.   metoCLOPramide 10 MG tablet Commonly known as: REGLAN Take 1 tablet (10 mg total) by mouth every 6 (six) hours.   multivitamin-prenatal 27-0.8 MG Tabs tablet Take 1 tablet by mouth daily at 12 noon.   ondansetron 4 MG disintegrating tablet Commonly known as: Zofran ODT Take 1 tablet (4 mg total) by mouth every 8 (eight) hours as needed for nausea or vomiting.   pantoprazole 40 MG tablet Commonly known as: Protonix Take 1 tablet (40 mg total) by mouth daily.   polyethylene glycol 17 g packet Commonly known as: MIRALAX / GLYCOLAX Take 17 g by mouth daily.   prochlorperazine 10 MG tablet  Commonly known as: COMPAZINE Take 1 tablet (10 mg total) by mouth 2 (two) times daily as needed for nausea or vomiting.   scopolamine 1 MG/3DAYS Commonly known as: TRANSDERM-SCOP Place 1 patch (1.5 mg total) onto the skin every 3 (three) days.          OBJECTIVE:   PHYSICAL EXAM: VS: BP 114/72 (BP Location: Left Arm, Patient Position: Sitting, Cuff Size: Large)   Pulse 76   Ht 5' 7"  (1.702 m)   Wt 210 lb (95.3 kg)   LMP 08/17/2021   SpO2 99%   BMI 32.89 kg/m    EXAM: General: Pt appears well and is in NAD  Eyes: External eye exam normal without stare, lid lag or exophthalmos.  EOM intact.  PERRL.  Neck: General: Supple without adenopathy. Thyroid: Thyroid size normal.  No goiter or nodules appreciated. No thyroid bruit.  Lungs:  Clear with good BS bilat with no rales, rhonchi, or wheezes  Heart: Auscultation: RRR.  Abdomen: Normoactive bowel sounds, soft, nontender, without masses or organomegaly palpable  Extremities:  BL LE: No pretibial edema normal ROM and strength.  Mental Status: Judgment, insight: Intact Orientation: Oriented to time, place, and person Mood and affect: No depression, anxiety, or agitation     DATA REVIEWED:   Latest Reference Range & Units 02/07/22 08:29  TSH 0.35 - 5.50 uIU/mL 1.12     ASSESSMENT / PLAN / RECOMMENDATIONS:   Hyperthyroidism :  - Pt is clinically euthyorid  - No local neck symptoms  - High suspicion for Graves' disease  - The goal of treatment is to maintain persistent but mild hyperthyroidism in the mother in an attempt to prevent fetal hypothyroidism since the fetal thyroid is more sensitive to the action of thionamide therapy. Overtreatment of maternal hyperthyroidism can cause fetal goiter and primary hypothyroidism.     Medications   Methimazole 5 mg , 3x a week      Signed electronically by: Mack Guise, MD  [facility]  [address] [place], [place] [address] Phone: [phone] FAX: [phone]      CC: Mckinley Jewel, MD [place] [address] Phone: [phone]  Fax: [phone]   Return to Endocrinology clinic as below: Future Appointments  Date Time Provider [place]  02/14/2022  9:00 AM CENTERING PROVIDER [facility]  02/27/2022  9:15 AM [facility] NURSE [facility]  02/27/2022  9:30 AM [facility] US3 [facility]  02/28/2022  9:00 AM CENTERING PROVIDER [facility]  03/14/2022  9:00 AM CENTERING PROVIDER [facility]  03/28/2022  9:00 AM CENTERING PROVIDER [facility]  04/11/2022  9:00 AM CENTERING PROVIDER [facility]  04/25/2022  9:00 AM CENTERING PROVIDER [facility]

## 2022-02-11 LAB — T4: T4, Total: 10.3 ug/dL (ref 5.1–11.9)

## 2022-02-11 LAB — TRAB (TSH RECEPTOR BINDING ANTIBODY): TRAB: <1 IU/L (ref <=2.00)

## 2022-02-11 LAB — T3: T3, Total: 146 ng/dL (ref 76–181)

## 2022-02-14 ENCOUNTER — Telehealth: Payer: Self-pay | Admitting: *Deleted

## 2022-02-14 ENCOUNTER — Encounter: Payer: Self-pay | Admitting: *Deleted

## 2022-02-14 ENCOUNTER — Encounter: Payer: 59 | Admitting: Advanced Practice Midwife

## 2022-02-14 NOTE — Telephone Encounter (Signed)
Margaret Goodman [facility] CenteringPregnancy prenatal visit today. Per chart review has [facility] 4 CenteringPregnancy appointment.  I called Margaret Goodman to notify her- I reached her voicemail and I left a message notifying her she missed her appointment today and that it is important she call to reschedule and that I am sending a Mychart message with additional information. Per her provider Margaret Goodman she can be offered to stay in Centering Pregnancy or change to traditional but eitherway needs makeup ob before next centering if possible. Will message registar to schedule makeup.  Bonita Quin ,RN

## 2022-02-27 ENCOUNTER — Ambulatory Visit: Payer: 59

## 2022-03-09 ENCOUNTER — Other Ambulatory Visit: Payer: Self-pay

## 2022-03-09 ENCOUNTER — Ambulatory Visit (INDEPENDENT_AMBULATORY_CARE_PROVIDER_SITE_OTHER): Payer: 59 | Admitting: Obstetrics & Gynecology

## 2022-03-09 VITALS — BP 113/71 | HR 87 | Wt 216.0 lb

## 2022-03-09 DIAGNOSIS — E0590 Thyrotoxicosis, unspecified without thyrotoxic crisis or storm: Secondary | ICD-10-CM

## 2022-03-09 DIAGNOSIS — O99280 Endocrine, nutritional and metabolic diseases complicating pregnancy, unspecified trimester: Secondary | ICD-10-CM

## 2022-03-09 DIAGNOSIS — Z3A28 28 weeks gestation of pregnancy: Secondary | ICD-10-CM

## 2022-03-09 DIAGNOSIS — Z3480 Encounter for supervision of other normal pregnancy, unspecified trimester: Secondary | ICD-10-CM

## 2022-03-09 NOTE — Progress Notes (Addendum)
   PRENATAL VISIT NOTE  Subjective:  Margaret Goodman is a 25 y.o. G4P1021 at [redacted]w[redacted]d being seen today for ongoing prenatal care.  She is currently monitored for the following issues for this high-risk pregnancy and has Vitamin D deficiency; Hyperthyroidism in pregnancy, antepartum; Supervision of high-risk pregnancy, third trimester; Family historic risk of congenital abnormality; and HPV in female on their problem list.  Patient reports no bleeding, no contractions, no cramping, and no leaking.  Contractions: Not present. Vag. Bleeding: None.  Movement: Present. Denies leaking of fluid.   The following portions of the patient's history were reviewed and updated as appropriate: allergies, current medications, past family history, past medical history, past social history, past surgical history and problem list.   Objective:   Vitals:   03/09/22 1531  BP: 113/71  Pulse: 87  Weight: 216 lb (98 kg)    Fetal Status: Fetal Heart Rate (bpm): 156   Movement: Present     General:  Alert, oriented and cooperative. Patient is in no acute distress.  Skin: Skin is warm and dry. No rash noted.   Cardiovascular: Normal heart rate noted  Respiratory: Normal respiratory effort, no problems with respiration noted  Abdomen: Soft, gravid, appropriate for gestational age.  Pain/Pressure: Absent     Pelvic: Cervical exam deferred        Extremities: Normal range of motion.     Mental Status: Normal mood and affect. Normal behavior. Normal judgment and thought content.   Assessment and Plan:  Pregnancy: G4P1021 at [redacted]w[redacted]d 1. Hyperthyroidism in pregnancy, antepartum - Not currently on medications because was told to discontinue these by endocrinology at a previous visit. - Thyroid labs ordered at this visit. To be collected tomorrow. - Patient will need weekly BPP's after [redacted] weeks gestation - Patient will need monthly thyroid labs Orders: - T4, free; Future - TSH; Future - T3, free; Future - [place] MFM FETAL  BPP WO NON STRESS; Future  2. [redacted] weeks gestation of pregnancy 3. Supervision of other normal pregnancy, antepartum -28-week labs ordered including 2 hr GTT, CBC HIV, RPR. Patient will return to do these tomorrow as she is not fasting today. Orders: - HIV antibody (with reflex); Future - RPR; Future - CBC; Future - Glucose Tolerance, 2 Hours w/1 Hour; Future No other significant concerns.  Preterm labor symptoms and general obstetric precautions including but not limited to vaginal bleeding, contractions, leaking of fluid and fetal movement were reviewed in detail with the patient. Please refer to After Visit Summary for other counseling recommendations.   Return in about 2 weeks (around 03/23/2022) for OFFICE OB VISIT (MD only).  Future Appointments  Date Time Provider Department Center  03/21/2022  8:15 AM [facility] NURSE [facility]  03/21/2022  8:30 AM [facility] US2 [facility]  03/24/2022 10:15 AM Rowan Bing, MD [facility]  04/10/2022  3:00 PM Shamleffer, Konrad Dolores, MD [facility] None    Celedonio Savage, MD

## 2022-03-09 NOTE — Patient Instructions (Addendum)
Return to office for any scheduled appointments. Call the office or go to the MAU at [facility] if: You begin to have strong, frequent contractions Your water breaks.  Sometimes it is a big gush of fluid, sometimes it is just a trickle that keeps getting your underwear wet or running down your legs You have vaginal bleeding.  It is normal to have a small amount of spotting if your cervix was checked.  You do not feel your baby moving like normal.  If you do not, get something to eat and drink and lay down and focus on feeling your baby move.   If your baby is still not moving like normal, you should call the office or go to MAU. Any other obstetric concerns.  

## 2022-03-21 ENCOUNTER — Ambulatory Visit: Payer: 59 | Admitting: *Deleted

## 2022-03-21 ENCOUNTER — Encounter: Payer: Self-pay | Admitting: *Deleted

## 2022-03-21 ENCOUNTER — Ambulatory Visit: Payer: 59 | Attending: Obstetrics and Gynecology

## 2022-03-21 ENCOUNTER — Other Ambulatory Visit: Payer: Self-pay | Admitting: *Deleted

## 2022-03-21 VITALS — BP 112/65 | HR 72

## 2022-03-21 DIAGNOSIS — O0993 Supervision of high risk pregnancy, unspecified, third trimester: Secondary | ICD-10-CM | POA: Insufficient documentation

## 2022-03-21 DIAGNOSIS — E669 Obesity, unspecified: Secondary | ICD-10-CM

## 2022-03-21 DIAGNOSIS — O99282 Endocrine, nutritional and metabolic diseases complicating pregnancy, second trimester: Secondary | ICD-10-CM | POA: Diagnosis not present

## 2022-03-21 DIAGNOSIS — Z6830 Body mass index (BMI) 30.0-30.9, adult: Secondary | ICD-10-CM

## 2022-03-21 DIAGNOSIS — Z3A30 30 weeks gestation of pregnancy: Secondary | ICD-10-CM

## 2022-03-21 DIAGNOSIS — E0590 Thyrotoxicosis, unspecified without thyrotoxic crisis or storm: Secondary | ICD-10-CM

## 2022-03-21 DIAGNOSIS — O352XX0 Maternal care for (suspected) hereditary disease in fetus, not applicable or unspecified: Secondary | ICD-10-CM | POA: Diagnosis not present

## 2022-03-21 DIAGNOSIS — O99280 Endocrine, nutritional and metabolic diseases complicating pregnancy, unspecified trimester: Secondary | ICD-10-CM | POA: Diagnosis present

## 2022-03-21 DIAGNOSIS — O99283 Endocrine, nutritional and metabolic diseases complicating pregnancy, third trimester: Secondary | ICD-10-CM

## 2022-03-21 DIAGNOSIS — O99213 Obesity complicating pregnancy, third trimester: Secondary | ICD-10-CM | POA: Diagnosis not present

## 2022-03-24 ENCOUNTER — Ambulatory Visit (INDEPENDENT_AMBULATORY_CARE_PROVIDER_SITE_OTHER): Payer: 59 | Admitting: Obstetrics and Gynecology

## 2022-03-24 ENCOUNTER — Other Ambulatory Visit: Payer: Self-pay

## 2022-03-24 VITALS — BP 115/65 | HR 77 | Wt 218.0 lb

## 2022-03-24 DIAGNOSIS — O0993 Supervision of high risk pregnancy, unspecified, third trimester: Secondary | ICD-10-CM

## 2022-03-24 DIAGNOSIS — Z3A30 30 weeks gestation of pregnancy: Secondary | ICD-10-CM

## 2022-03-24 DIAGNOSIS — O99280 Endocrine, nutritional and metabolic diseases complicating pregnancy, unspecified trimester: Secondary | ICD-10-CM

## 2022-03-24 DIAGNOSIS — E0590 Thyrotoxicosis, unspecified without thyrotoxic crisis or storm: Secondary | ICD-10-CM

## 2022-03-24 DIAGNOSIS — O358XX0 Maternal care for other (suspected) fetal abnormality and damage, not applicable or unspecified: Secondary | ICD-10-CM

## 2022-03-24 NOTE — Progress Notes (Signed)
Pt has not had her 2 hr labs, is not prepared to stay today to get it done, offered 1 hr & declined . Wants to be scheduled to come back. No questions nor concerns.

## 2022-03-24 NOTE — Progress Notes (Addendum)
   PRENATAL VISIT NOTE  Subjective:  Margaret Goodman is a 26 y.o. G4P1021 at [redacted]w[redacted]d being seen today for ongoing prenatal care.  She is currently monitored for the following issues for this high-risk pregnancy and has Vitamin D deficiency; Hyperthyroidism in pregnancy, antepartum; Supervision of high-risk pregnancy, third trimester; Family historic risk of congenital abnormality; and HPV in female on their problem list.  Patient reports no complaints.  Contractions: Not present. Vag. Bleeding: None.  Movement: Present. Denies leaking of fluid.   The following portions of the patient's history were reviewed and updated as appropriate: allergies, current medications, past family history, past medical history, past social history, past surgical history and problem list.   Objective:   Vitals:   03/24/22 1035  BP: 115/65  Pulse: 77  Weight: 218 lb (98.9 kg)    Fetal Status: Fetal Heart Rate (bpm): 175   Movement: Present     General:  Alert, oriented and cooperative. Patient is in no acute distress.  Skin: Skin is warm and dry. No rash noted.   Cardiovascular: Normal heart rate noted  Respiratory: Normal respiratory effort, no problems with respiration noted  Abdomen: Soft, gravid, appropriate for gestational age.  Pain/Pressure: Absent     Pelvic: Cervical exam deferred        Extremities: Normal range of motion.  Edema: None  Mental Status: Normal mood and affect. Normal behavior. Normal judgment and thought content.   Assessment and Plan:  Pregnancy: G4P1021 at [redacted]w[redacted]d 1. [redacted] weeks gestation of pregnancy Can't stay for 2h (it wasn't scheduled). Pt to try and come for lab only visit next week Unsure about birth control   Bedside u/s done and cephalic and FHR in the 140s 2. Hyperthyroidism in pregnancy, antepartum On no meds. Followed by endo. Repeat TFTs qmonth. Can recheck with gtt Bpp to start in two weeks>>continue weekly until delivery 8/1: 51%, 1564gm, ac 65%, bpp 8/8, afi  10.8  3. Supervision of high-risk pregnancy, third trimester  Preterm labor symptoms and general obstetric precautions including but not limited to vaginal bleeding, contractions, leaking of fluid and fetal movement were reviewed in detail with the patient. Please refer to After Visit Summary for other counseling recommendations.   Return in about 2 weeks (around 04/07/2022) for in person, md visit, high risk ob.  Future Appointments  Date Time Provider Department Center  04/05/2022 11:15 AM [facility] NST [facility]  04/10/2022  3:00 PM Shamleffer, Konrad Dolores, MD [facility] None  04/13/2022 10:15 AM [facility]ST [facility]  04/19/2022 11:15 AM [facility] NURSE [facility]  04/19/2022 11:30 AM [facility] US3 [facility]  04/26/2022 11:15 AM [facility] NST [facility]    Havana Bing, MD

## 2022-03-28 ENCOUNTER — Other Ambulatory Visit: Payer: 59

## 2022-03-28 ENCOUNTER — Other Ambulatory Visit: Payer: Self-pay

## 2022-03-28 DIAGNOSIS — Z6830 Body mass index (BMI) 30.0-30.9, adult: Secondary | ICD-10-CM

## 2022-03-28 DIAGNOSIS — O0993 Supervision of high risk pregnancy, unspecified, third trimester: Secondary | ICD-10-CM

## 2022-03-28 DIAGNOSIS — Z3A28 28 weeks gestation of pregnancy: Secondary | ICD-10-CM

## 2022-03-28 DIAGNOSIS — Z3480 Encounter for supervision of other normal pregnancy, unspecified trimester: Secondary | ICD-10-CM

## 2022-03-28 DIAGNOSIS — O352XX0 Maternal care for (suspected) hereditary disease in fetus, not applicable or unspecified: Secondary | ICD-10-CM

## 2022-03-28 DIAGNOSIS — E0590 Thyrotoxicosis, unspecified without thyrotoxic crisis or storm: Secondary | ICD-10-CM

## 2022-03-29 ENCOUNTER — Encounter (INDEPENDENT_AMBULATORY_CARE_PROVIDER_SITE_OTHER): Payer: Self-pay

## 2022-03-30 LAB — CBC
Hematocrit: 28.9 % — ABNORMAL LOW (ref 34.0–46.6)
Hemoglobin: 9.7 g/dL — ABNORMAL LOW (ref 11.1–15.9)
MCH: 30.2 pg (ref 26.6–33.0)
MCHC: 33.6 g/dL (ref 31.5–35.7)
MCV: 90 fL (ref 79–97)
Platelets: 210 10*3/uL (ref 150–450)
RBC: 3.21 x10E6/uL — ABNORMAL LOW (ref 3.77–5.28)
RDW: 12.1 % (ref 11.7–15.4)
WBC: 5.8 10*3/uL (ref 3.4–10.8)

## 2022-03-30 LAB — GLUCOSE TOLERANCE, 2 HOURS W/ 1HR
Glucose, 1 hour: 74 mg/dL (ref 70–179)
Glucose, 2 hour: 92 mg/dL (ref 70–152)
Glucose, Fasting: 74 mg/dL (ref 70–91)

## 2022-03-30 LAB — HIV ANTIBODY (ROUTINE TESTING W REFLEX): HIV Screen 4th Generation wRfx: NONREACTIVE

## 2022-03-30 LAB — T4, FREE: Free T4: 0.9 ng/dL (ref 0.82–1.77)

## 2022-03-30 LAB — TSH: TSH: 0.643 u[IU]/mL (ref 0.450–4.500)

## 2022-03-30 LAB — RPR: RPR Ser Ql: NONREACTIVE

## 2022-03-30 LAB — T3, FREE: T3, Free: 2.1 pg/mL (ref 2.0–4.4)

## 2022-04-05 ENCOUNTER — Ambulatory Visit: Payer: 59 | Admitting: *Deleted

## 2022-04-05 ENCOUNTER — Ambulatory Visit (INDEPENDENT_AMBULATORY_CARE_PROVIDER_SITE_OTHER): Payer: 59

## 2022-04-05 ENCOUNTER — Other Ambulatory Visit: Payer: Self-pay

## 2022-04-05 VITALS — BP 105/63 | HR 91

## 2022-04-05 DIAGNOSIS — O99280 Endocrine, nutritional and metabolic diseases complicating pregnancy, unspecified trimester: Secondary | ICD-10-CM

## 2022-04-05 DIAGNOSIS — E0590 Thyrotoxicosis, unspecified without thyrotoxic crisis or storm: Secondary | ICD-10-CM | POA: Diagnosis not present

## 2022-04-05 NOTE — Progress Notes (Signed)

## 2022-04-10 ENCOUNTER — Ambulatory Visit (INDEPENDENT_AMBULATORY_CARE_PROVIDER_SITE_OTHER): Payer: 59 | Admitting: Internal Medicine

## 2022-04-10 ENCOUNTER — Encounter: Payer: Self-pay | Admitting: Internal Medicine

## 2022-04-10 VITALS — BP 120/70 | HR 71 | Ht 67.0 in | Wt 224.0 lb

## 2022-04-10 DIAGNOSIS — O99280 Endocrine, nutritional and metabolic diseases complicating pregnancy, unspecified trimester: Secondary | ICD-10-CM

## 2022-04-10 DIAGNOSIS — E0590 Thyrotoxicosis, unspecified without thyrotoxic crisis or storm: Secondary | ICD-10-CM | POA: Diagnosis not present

## 2022-04-10 NOTE — Progress Notes (Unsigned)
Name: Margaret Goodman  MRN/ DOB: [id number], [birth year]    Age/ Sex: 26 y.o., female     PCP: Mckinley Jewel, MD   Reason for Endocrinology Evaluation: Hyperthyroidism     Initial Endocrinology Clinic Visit: 10/20/2021    PATIENT IDENTIFIER: Margaret Goodman is a 26 y.o., female with a past medical history of hyperthyroidism. She has followed with [facility] since 10/20/2021 for consultative assistance with management of her Hyperthyroidism.     HISTORICAL SUMMARY: The patient was first diagnosed with hyperthyroidism in 02/2021.  She was initially on methimazole but this was switched to PTU during pregnancy 09/2021 and was switched back to methimazole during her second trimester in May 2023 during her second pregnancy which ended up with miscarriage. Her TFT's normalized after that until her 3rd pregnancy   She has a 31 yr old child at home  No Fh of thyroid disease   SUBJECTIVE:    Today (04/10/2022):  [name] is here for follow-up follow-up on hyperthyroidism , she is currently 32.6 weeks of gestation (boy Margaret Goodman)   She has been off methimazole since 01/2022  Weight has slightly increased  She is tired, not sleeping well at night  Denies local neck swelling  Has occasional palpitations  Denies tremors  Has constipation , takes Miralax     EDD 05/30/2022  HISTORY:  Past Medical History:  Past Medical History:  Diagnosis Date   Depression    dr had conversation today about therapy before medicine   Hyperlipidemia    Hyperthyroidism    Hypotension    UTI (urinary tract infection)    Vaginal Pap smear, abnormal    ok since   Past Surgical History:  Past Surgical History:  Procedure Laterality Date   NO PAST SURGERIES     Social History:  reports that she has never smoked. She has never used smokeless tobacco. She reports that she does not currently use alcohol. She reports that she does not use drugs. Family History:  Family History  Problem  Relation Age of Onset   Healthy Mother    Healthy Father    Thyroid disease Neg Hx      HOME MEDICATIONS: Allergies as of 04/10/2022       Reactions   Tamiflu [oseltamivir Phosphate] Hives        Medication List        Accurate as of April 10, 2022  8:49 AM. If you have any questions, ask your nurse or doctor.          Blood Pressure Kit Devi 1 each by Does not apply route once a week.   famotidine 20 MG tablet Commonly known as: PEPCID Take 20 mg by mouth 2 (two) times daily.   metoCLOPramide 10 MG tablet Commonly known as: REGLAN Take 1 tablet (10 mg total) by mouth every 6 (six) hours.   multivitamin-prenatal 27-0.8 MG Tabs tablet Take 1 tablet by mouth daily at 12 noon.   ondansetron 4 MG disintegrating tablet Commonly known as: Zofran ODT Take 1 tablet (4 mg total) by mouth every 8 (eight) hours as needed for nausea or vomiting.   pantoprazole 40 MG tablet Commonly known as: Protonix Take 1 tablet (40 mg total) by mouth daily.   polyethylene glycol 17 g packet Commonly known as: MIRALAX / GLYCOLAX Take 17 g by mouth daily.   prochlorperazine 10 MG tablet Commonly known as: COMPAZINE Take 1 tablet (10 mg total) by mouth 2 (two) times daily  as needed for nausea or vomiting.   scopolamine 1 MG/3DAYS Commonly known as: TRANSDERM-SCOP Place 1 patch (1.5 mg total) onto the skin every 3 (three) days.          OBJECTIVE:   PHYSICAL EXAM: VS:BP 120/70 (BP Location: Left Arm, Patient Position: Sitting, Cuff Size: Small)   Pulse 71   Ht 5' 7"  (1.702 m)   Wt 224 lb (101.6 kg)   LMP 08/17/2021   SpO2 94%   BMI 35.08 kg/m    EXAM: General: Pt appears well and is in NAD  Eyes: External eye exam normal without stare, lid lag or exophthalmos.  EOM intact.  PERRL.  Neck: General: Supple without adenopathy. Thyroid: Thyroid size normal.  No goiter or nodules appreciated.   Lungs: Clear with good BS bilat with no rales, rhonchi, or wheezes  Heart:  Auscultation: RRR.  Abdomen: Gravid uterus  Extremities:  BL LE: No pretibial edema normal ROM and strength.  Mental Status: Judgment, insight: Intact Orientation: Oriented to time, place, and person Mood and affect: No depression, anxiety, or agitation     DATA REVIEWED:   Latest Reference Range & Units 04/10/22 15:08  Triiodothyronine (T3) 76 - 181 ng/dL 145  Thyroxine (T4) 5.1 - 11.9 mcg/dL 10.4   ASSESSMENT / PLAN / RECOMMENDATIONS:   Hyperthyroidism :  - Pt is clinically euthyorid  - No local neck symptoms  - High suspicion for Graves' disease  -She has been off thionamide therapy since June 2023 -Repeat TFTs are normal  Follow-up in 3 months  Labs in 4 weeks    Signed electronically by: Mack Guise, MD  [facility] Endocrinology  [address] [place], [place] [address] Phone: [phone] FAX: [phone]      CC: Mckinley Jewel, MD [place] [address] Phone: [phone]  Fax: [phone]   Return to Endocrinology clinic as below: Future Appointments  Date Time Provider [place]  04/10/2022  3:00 PM Corda Shutt, Melanie Crazier, MD [facility] None  04/13/2022 10:15 AM [facility] NST [facility]  04/17/2022 10:15 AM Aletha Halim, MD [facility]  04/19/2022 11:15 AM [facility] NURSE [facility]  04/19/2022 11:30 AM [facility] US3 [facility]  04/26/2022 11:15 AM [facility] NST [facility]  05/01/2022 10:15 AM Aletha Halim, MD [facility]  05/01/2022 11:15 AM [facility] NST [facility]  05/08/2022 10:35 AM Griffin Basil, MD [facility]  05/08/2022 11:15 AM [facility] NST [facility]  05/15/2022  9:15 AM [facility] NST [facility]  05/15/2022 10:15 AM Radene Gunning, MD [facility]  05/22/2022  9:15 AM [facility] NST [facility]  05/22/2022 10:15 AM Radene Gunning, MD [facility]  05/29/2022 10:15 AM Chancy Milroy, MD [facility]  06/05/2022  9:55 AM Griffin Basil, MD [facility]   06/05/2022 11:15 AM Day, Ronnell Freshwater, RN [facility]

## 2022-04-11 LAB — T4: T4, Total: 10.4 ug/dL (ref 5.1–11.9)

## 2022-04-11 LAB — TSH: TSH: 1.07 u[IU]/mL (ref 0.35–5.50)

## 2022-04-11 LAB — T3: T3, Total: 145 ng/dL (ref 76–181)

## 2022-04-13 ENCOUNTER — Other Ambulatory Visit: Payer: Self-pay

## 2022-04-13 ENCOUNTER — Ambulatory Visit (INDEPENDENT_AMBULATORY_CARE_PROVIDER_SITE_OTHER): Payer: 59

## 2022-04-13 ENCOUNTER — Ambulatory Visit (INDEPENDENT_AMBULATORY_CARE_PROVIDER_SITE_OTHER): Payer: 59 | Admitting: General Practice

## 2022-04-13 VITALS — BP 115/68 | HR 87

## 2022-04-13 DIAGNOSIS — O99280 Endocrine, nutritional and metabolic diseases complicating pregnancy, unspecified trimester: Secondary | ICD-10-CM

## 2022-04-13 DIAGNOSIS — E0590 Thyrotoxicosis, unspecified without thyrotoxic crisis or storm: Secondary | ICD-10-CM | POA: Diagnosis not present

## 2022-04-13 NOTE — Progress Notes (Signed)
Pt informed that the ultrasound is considered a limited OB ultrasound and is not intended to be a complete ultrasound exam.  Patient also informed that the ultrasound is not being completed with the intent of assessing for fetal or placental anomalies or any pelvic abnormalities.  Explained that the purpose of today's ultrasound is to assess for  BPP, presentation, and AFI.  Patient acknowledges the purpose of the exam and the limitations of the study.    Chase Caller RN BSN 04/13/22

## 2022-04-17 ENCOUNTER — Encounter: Payer: Self-pay | Admitting: Obstetrics and Gynecology

## 2022-04-19 ENCOUNTER — Ambulatory Visit: Payer: 59 | Admitting: *Deleted

## 2022-04-19 ENCOUNTER — Ambulatory Visit: Payer: 59 | Attending: Obstetrics

## 2022-04-19 VITALS — BP 98/61 | HR 85

## 2022-04-19 DIAGNOSIS — O0993 Supervision of high risk pregnancy, unspecified, third trimester: Secondary | ICD-10-CM

## 2022-04-19 DIAGNOSIS — Z6830 Body mass index (BMI) 30.0-30.9, adult: Secondary | ICD-10-CM | POA: Insufficient documentation

## 2022-04-19 DIAGNOSIS — O99213 Obesity complicating pregnancy, third trimester: Secondary | ICD-10-CM | POA: Diagnosis not present

## 2022-04-19 DIAGNOSIS — O352XX0 Maternal care for (suspected) hereditary disease in fetus, not applicable or unspecified: Secondary | ICD-10-CM | POA: Diagnosis present

## 2022-04-19 DIAGNOSIS — O99283 Endocrine, nutritional and metabolic diseases complicating pregnancy, third trimester: Secondary | ICD-10-CM | POA: Insufficient documentation

## 2022-04-19 DIAGNOSIS — Z3A34 34 weeks gestation of pregnancy: Secondary | ICD-10-CM

## 2022-04-19 DIAGNOSIS — E0590 Thyrotoxicosis, unspecified without thyrotoxic crisis or storm: Secondary | ICD-10-CM | POA: Insufficient documentation

## 2022-04-19 DIAGNOSIS — E669 Obesity, unspecified: Secondary | ICD-10-CM

## 2022-04-24 ENCOUNTER — Encounter: Payer: Self-pay | Admitting: Radiology

## 2022-04-26 ENCOUNTER — Ambulatory Visit: Payer: 59 | Admitting: *Deleted

## 2022-04-26 ENCOUNTER — Ambulatory Visit (INDEPENDENT_AMBULATORY_CARE_PROVIDER_SITE_OTHER): Payer: 59

## 2022-04-26 ENCOUNTER — Other Ambulatory Visit: Payer: Self-pay

## 2022-04-26 DIAGNOSIS — E0590 Thyrotoxicosis, unspecified without thyrotoxic crisis or storm: Secondary | ICD-10-CM | POA: Diagnosis not present

## 2022-04-26 DIAGNOSIS — O99280 Endocrine, nutritional and metabolic diseases complicating pregnancy, unspecified trimester: Secondary | ICD-10-CM

## 2022-05-01 ENCOUNTER — Other Ambulatory Visit: Payer: 59

## 2022-05-01 ENCOUNTER — Ambulatory Visit (INDEPENDENT_AMBULATORY_CARE_PROVIDER_SITE_OTHER): Payer: 59 | Admitting: Obstetrics and Gynecology

## 2022-05-01 ENCOUNTER — Other Ambulatory Visit: Payer: Self-pay

## 2022-05-01 VITALS — BP 110/66 | HR 92 | Wt 226.2 lb

## 2022-05-01 DIAGNOSIS — O99283 Endocrine, nutritional and metabolic diseases complicating pregnancy, third trimester: Secondary | ICD-10-CM

## 2022-05-01 DIAGNOSIS — E0590 Thyrotoxicosis, unspecified without thyrotoxic crisis or storm: Secondary | ICD-10-CM | POA: Diagnosis not present

## 2022-05-01 DIAGNOSIS — O0993 Supervision of high risk pregnancy, unspecified, third trimester: Secondary | ICD-10-CM

## 2022-05-01 DIAGNOSIS — Z3A35 35 weeks gestation of pregnancy: Secondary | ICD-10-CM

## 2022-05-01 NOTE — Progress Notes (Signed)
     PRENATAL VISIT NOTE  Subjective:  Margaret Goodman is a 26 y.o. G4P1021 at [redacted]w[redacted]d being seen today for ongoing prenatal care.  She is currently monitored for the following issues for this high-risk pregnancy and has Vitamin D deficiency; Hyperthyroidism in pregnancy, antepartum; Supervision of high-risk pregnancy, third trimester; Family historic risk of congenital abnormality; and HPV in female on their problem list.  Patient reports no complaints.  Contractions: Irritability. Vag. Bleeding: None.  Movement: Present. Denies leaking of fluid.   The following portions of the patient's history were reviewed and updated as appropriate: allergies, current medications, past family history, past medical history, past social history, past surgical history and problem list.   Objective:   Vitals:   05/01/22 1032  BP: 110/66  Pulse: 92  Weight: 226 lb 3.2 oz (102.6 kg)    Fetal Status: Fetal Heart Rate (bpm): 143   Movement: Present     General:  Alert, oriented and cooperative. Patient is in no acute distress.  Skin: Skin is warm and dry. No rash noted.   Cardiovascular: Normal heart rate noted  Respiratory: Normal respiratory effort, no problems with respiration noted  Abdomen: Soft, gravid, appropriate for gestational age.  Pain/Pressure: Absent     Pelvic: Cervical exam deferred        Extremities: Normal range of motion.  Edema: Trace  Mental Status: Normal mood and affect. Normal behavior. Normal judgment and thought content.   Assessment and Plan:  Pregnancy: G4P1021 at [redacted]w[redacted]d 1. Hyperthyroidism in pregnancy, antepartum No on meds. F/u endocrine, last visit on 8/21 and normal labs. Has 9/22 qmonth surveillance lab visit on 9/22.  Continue weekly surveillance. No need for further growth scans per mfm. She states at her last visit with mfm they said they recommended 39wk delivery. Nothing in their note states that but I told her that that is reasonable given her multip status and h/o  hyperthyroidism. I told her we can set that up next visit Bpp person out today but will do nst today 9/6 bpp: cephalic, 10/10, afi 12.8 8/30 growth u/s: 45%, ac 64%, normal afi   NST: FHT: 135 baseline, positive accelerations, negative deceleration, moderate variability Toco: rare contractions Time: 20 minutes  A/P: reactive non stress test  2. Supervision of high-risk pregnancy, third trimester GBS next visit ?OCPs  3. [redacted] weeks gestation of pregnancy  Preterm labor symptoms and general obstetric precautions including but not limited to vaginal bleeding, contractions, leaking of fluid and fetal movement were reviewed in detail with the patient. Please refer to After Visit Summary for other counseling recommendations.   No follow-ups on file.  Future Appointments  Date Time Provider Department Center  05/08/2022 10:35 AM Warden Fillers, MD [facility]  05/08/2022 11:15 AM [facility] NST [facility]  05/12/2022  8:45 AM [facility] LAB [facility] None  05/15/2022  9:15 AM [facility] NST [facility]  05/15/2022 10:15 AM Milas Hock, MD [facility]  05/22/2022  9:15 AM [facility] NST [facility]  05/22/2022 10:15 AM Federico Flake, MD [facility]  05/29/2022 10:15 AM Hermina Staggers, MD [facility]  06/05/2022  9:55 AM Warden Fillers, MD [facility]  06/05/2022 11:15 AM Day, Drucilla Schmidt, RN [facility]    [place] Bing, MD

## 2022-05-03 ENCOUNTER — Telehealth: Payer: Self-pay

## 2022-05-03 NOTE — Telephone Encounter (Signed)
Margaret Goodman with [facility] called to confirm this is a current patient of Pickens, MD. Case ID #[id number]. Would like to also confirm that breast pump order was received.

## 2022-05-04 NOTE — Telephone Encounter (Signed)
Call placed back to [facility]. Spoke with [facility] [place]. Confirmed pt and breast pump order.  Judeth Cornfield, RNC

## 2022-05-08 ENCOUNTER — Ambulatory Visit: Payer: 59 | Admitting: *Deleted

## 2022-05-08 ENCOUNTER — Other Ambulatory Visit (HOSPITAL_COMMUNITY)
Admission: RE | Admit: 2022-05-08 | Discharge: 2022-05-08 | Disposition: A | Discharge status: Home / Self Care | Payer: 59 | Source: Ambulatory Visit | Attending: Obstetrics and Gynecology | Admitting: Obstetrics and Gynecology

## 2022-05-08 ENCOUNTER — Other Ambulatory Visit: Payer: Self-pay | Admitting: Obstetrics and Gynecology

## 2022-05-08 ENCOUNTER — Ambulatory Visit (INDEPENDENT_AMBULATORY_CARE_PROVIDER_SITE_OTHER): Payer: 59

## 2022-05-08 ENCOUNTER — Ambulatory Visit (INDEPENDENT_AMBULATORY_CARE_PROVIDER_SITE_OTHER): Payer: 59 | Admitting: Obstetrics and Gynecology

## 2022-05-08 ENCOUNTER — Other Ambulatory Visit: Payer: Self-pay

## 2022-05-08 VITALS — BP 113/70 | HR 99 | Wt 225.5 lb

## 2022-05-08 DIAGNOSIS — O219 Vomiting of pregnancy, unspecified: Secondary | ICD-10-CM | POA: Diagnosis not present

## 2022-05-08 DIAGNOSIS — B977 Papillomavirus as the cause of diseases classified elsewhere: Secondary | ICD-10-CM | POA: Diagnosis not present

## 2022-05-08 DIAGNOSIS — E0590 Thyrotoxicosis, unspecified without thyrotoxic crisis or storm: Secondary | ICD-10-CM

## 2022-05-08 DIAGNOSIS — O0993 Supervision of high risk pregnancy, unspecified, third trimester: Secondary | ICD-10-CM

## 2022-05-08 DIAGNOSIS — O99283 Endocrine, nutritional and metabolic diseases complicating pregnancy, third trimester: Secondary | ICD-10-CM | POA: Diagnosis not present

## 2022-05-08 DIAGNOSIS — O99280 Endocrine, nutritional and metabolic diseases complicating pregnancy, unspecified trimester: Secondary | ICD-10-CM

## 2022-05-08 DIAGNOSIS — O358XX0 Maternal care for other (suspected) fetal abnormality and damage, not applicable or unspecified: Secondary | ICD-10-CM | POA: Diagnosis not present

## 2022-05-08 DIAGNOSIS — Z3490 Encounter for supervision of normal pregnancy, unspecified, unspecified trimester: Secondary | ICD-10-CM

## 2022-05-08 DIAGNOSIS — Z3A36 36 weeks gestation of pregnancy: Secondary | ICD-10-CM

## 2022-05-08 MED ORDER — PANTOPRAZOLE SODIUM 40 MG PO TBEC: 40.0000 mg | DELAYED_RELEASE_TABLET | Freq: Every day | ORAL | 1 refills | Status: DC

## 2022-05-08 MED ORDER — SCOPOLAMINE 1 MG/3DAYS TD PT72: 1.0000 | MEDICATED_PATCH | TRANSDERMAL | 6 refills | Status: DC

## 2022-05-08 NOTE — Addendum Note (Signed)
Addended by: Griffin Basil on: 05/08/2022 11:38 AM   Modules accepted: Orders

## 2022-05-08 NOTE — Progress Notes (Signed)
   PRENATAL VISIT NOTE  Subjective:  Margaret Goodman is a 26 y.o. G4P1021 at [redacted]w[redacted]d being seen today for ongoing prenatal care.  She is currently monitored for the following issues for this high-risk pregnancy and has Vitamin D deficiency; Hyperthyroidism in pregnancy, antepartum; Supervision of high-risk pregnancy, third trimester; Family historic risk of congenital abnormality; and HPV in female on their problem list.  Patient doing well with no acute concerns today. She reports no complaints.  Contractions: Not present. Vag. Bleeding: None.  Movement: Present. Denies leaking of fluid.   The following portions of the patient's history were reviewed and updated as appropriate: allergies, current medications, past family history, past medical history, past social history, past surgical history and problem list. Problem list updated.  Objective:   Vitals:   05/08/22 1051  BP: 113/70  Pulse: 99  Weight: 225 lb 8 oz (102.3 kg)    Fetal Status: Fetal Heart Rate (bpm): 140 Fundal Height: 37 cm Movement: Present     General:  Alert, oriented and cooperative. Patient is in no acute distress.  Skin: Skin is warm and dry. No rash noted.   Cardiovascular: Normal heart rate noted  Respiratory: Normal respiratory effort, no problems with respiration noted  Abdomen: Soft, gravid, appropriate for gestational age.  Pain/Pressure: Absent     Pelvic: Cervical exam performed Dilation: Fingertip Effacement (%): 60 Station: Ballotable  Extremities: Normal range of motion.     Mental Status:  Normal mood and affect. Normal behavior. Normal judgment and thought content.   Assessment and Plan:  Pregnancy: G4P1021 at [redacted]w[redacted]d  1. [redacted] weeks gestation of pregnancy   2. Hyperthyroidism in pregnancy, antepartum Review of labs shows normal TSH, consider thyroid panel at next visit  3. Supervision of high-risk pregnancy, third trimester Continue routine prenatal care, IOL scheduled at 39 weeks , spoke with Dr.  Donalee Citrin to confirm  - Culture, beta strep (group b only) - GC/Chlamydia probe amp ([place])not at [facility]  4. HPV in female Pap postpartum  5. Family or maternal historic risk of congenital anomaly, antepartum, single or unspecified fetus Previous fetus with hx of Dandy-Walker  Preterm labor symptoms and general obstetric precautions including but not limited to vaginal bleeding, contractions, leaking of fluid and fetal movement were reviewed in detail with the patient.  Please refer to After Visit Summary for other counseling recommendations.   Return in about 1 week (around 05/15/2022) for [facility], in person.   Lynnda Shields, MD Faculty Attending Center for [facility]

## 2022-05-09 LAB — GC/CHLAMYDIA PROBE AMP (~~LOC~~) NOT AT ARMC
Chlamydia: NEGATIVE
Comment: NEGATIVE
Comment: NORMAL
Neisseria Gonorrhea: NEGATIVE

## 2022-05-10 NOTE — Progress Notes (Deleted)
   PRENATAL VISIT NOTE  Subjective:  Margaret Goodman is a 26 y.o. L8G5364 at 37w***d being seen today for ongoing prenatal care.  She is currently monitored for the following issues for this high-risk pregnancy and has Vitamin D deficiency; Hyperthyroidism in pregnancy, antepartum; Supervision of high-risk pregnancy, third trimester; Family historic risk of congenital abnormality; and HPV in female on their problem list.  Patient reports {sx:14538}.   .  .   . Denies leaking of fluid.   The following portions of the patient's history were reviewed and updated as appropriate: allergies, current medications, past family history, past medical history, past social history, past surgical history and problem list.   Objective:  There were no vitals filed for this visit.  Fetal Status:           General:  Alert, oriented and cooperative. Patient is in no acute distress.  Skin: Skin is warm and dry. No rash noted.   Cardiovascular: Normal heart rate noted  Respiratory: Normal respiratory effort, no problems with respiration noted  Abdomen: Soft, gravid, appropriate for gestational age.        Pelvic: {Blank single:19197::"Cervical exam performed in the presence of a chaperone","Cervical exam deferred"}        Extremities: Normal range of motion.     Mental Status: Normal mood and affect. Normal behavior. Normal judgment and thought content.   Assessment and Plan:  Pregnancy: W8E3212 at 37w***d 1. Hyperthyroidism in pregnancy, antepartum TFTs normal 8/21.  Recheck today. *** >Done on 9/22?  2. Supervision of high-risk pregnancy, third trimester GBS *** Offered flu shot and TDAP - pt ***  3. HPV in female Plan is for pap PP  Term labor symptoms and general obstetric precautions including but not limited to vaginal bleeding, contractions, leaking of fluid and fetal movement were reviewed in detail with the patient. Please refer to After Visit Summary for other counseling recommendations.    No follow-ups on file.  Future Appointments  Date Time Provider [place]  05/12/2022  8:45 AM [facility] LAB [facility] None  05/15/2022  9:15 AM [facility] NST [facility]  05/15/2022 10:15 AM Radene Gunning, MD [facility]  05/22/2022  9:15 AM [facility] NST [facility]  05/22/2022 10:15 AM Caren Macadam, MD [facility]  05/25/2022  6:30 AM MC-LD SCHED ROOM [facility]-INDC None  05/29/2022 10:15 AM Chancy Milroy, MD [facility]    Radene Gunning, MD

## 2022-05-12 ENCOUNTER — Other Ambulatory Visit (INDEPENDENT_AMBULATORY_CARE_PROVIDER_SITE_OTHER): Payer: 59

## 2022-05-12 DIAGNOSIS — O99280 Endocrine, nutritional and metabolic diseases complicating pregnancy, unspecified trimester: Secondary | ICD-10-CM | POA: Diagnosis not present

## 2022-05-12 DIAGNOSIS — E0590 Thyrotoxicosis, unspecified without thyrotoxic crisis or storm: Secondary | ICD-10-CM

## 2022-05-12 LAB — TSH: TSH: 1.09 u[IU]/mL (ref 0.35–5.50)

## 2022-05-12 LAB — CULTURE, BETA STREP (GROUP B ONLY): Strep Gp B Culture: NEGATIVE

## 2022-05-13 LAB — T4: T4, Total: 8.9 ug/dL (ref 5.1–11.9)

## 2022-05-13 LAB — T3: T3, Total: 122 ng/dL (ref 76–181)

## 2022-05-15 ENCOUNTER — Other Ambulatory Visit: Payer: 59

## 2022-05-15 ENCOUNTER — Encounter: Payer: Self-pay | Admitting: Obstetrics and Gynecology

## 2022-05-15 DIAGNOSIS — B977 Papillomavirus as the cause of diseases classified elsewhere: Secondary | ICD-10-CM

## 2022-05-15 DIAGNOSIS — E0590 Thyrotoxicosis, unspecified without thyrotoxic crisis or storm: Secondary | ICD-10-CM

## 2022-05-15 DIAGNOSIS — O0993 Supervision of high risk pregnancy, unspecified, third trimester: Secondary | ICD-10-CM

## 2022-05-17 ENCOUNTER — Other Ambulatory Visit: Payer: Self-pay | Admitting: Advanced Practice Midwife

## 2022-05-18 ENCOUNTER — Encounter (HOSPITAL_COMMUNITY): Payer: Self-pay

## 2022-05-18 ENCOUNTER — Telehealth (HOSPITAL_COMMUNITY): Payer: Self-pay | Admitting: *Deleted

## 2022-05-18 NOTE — Telephone Encounter (Signed)
Preadmission screen  

## 2022-05-19 ENCOUNTER — Encounter (HOSPITAL_COMMUNITY): Payer: Self-pay | Admitting: Obstetrics and Gynecology

## 2022-05-19 ENCOUNTER — Inpatient Hospital Stay (HOSPITAL_COMMUNITY): Payer: 59 | Admitting: Anesthesiology

## 2022-05-19 ENCOUNTER — Encounter (HOSPITAL_COMMUNITY): Payer: Self-pay | Admitting: *Deleted

## 2022-05-19 ENCOUNTER — Telehealth (HOSPITAL_COMMUNITY): Payer: Self-pay | Admitting: *Deleted

## 2022-05-19 ENCOUNTER — Other Ambulatory Visit: Payer: Self-pay

## 2022-05-19 ENCOUNTER — Inpatient Hospital Stay (HOSPITAL_COMMUNITY)
Admission: AD | Admit: 2022-05-19 | Discharge: 2022-05-21 | DRG: 807 | Disposition: A | Discharge status: Home / Self Care | Payer: 59 | Attending: Obstetrics and Gynecology | Admitting: Obstetrics and Gynecology

## 2022-05-19 DIAGNOSIS — O99280 Endocrine, nutritional and metabolic diseases complicating pregnancy, unspecified trimester: Secondary | ICD-10-CM | POA: Diagnosis present

## 2022-05-19 DIAGNOSIS — Z3A38 38 weeks gestation of pregnancy: Secondary | ICD-10-CM

## 2022-05-19 DIAGNOSIS — E0590 Thyrotoxicosis, unspecified without thyrotoxic crisis or storm: Secondary | ICD-10-CM | POA: Diagnosis present

## 2022-05-19 DIAGNOSIS — Z3490 Encounter for supervision of normal pregnancy, unspecified, unspecified trimester: Secondary | ICD-10-CM

## 2022-05-19 DIAGNOSIS — O99284 Endocrine, nutritional and metabolic diseases complicating childbirth: Principal | ICD-10-CM | POA: Diagnosis present

## 2022-05-19 DIAGNOSIS — O99283 Endocrine, nutritional and metabolic diseases complicating pregnancy, third trimester: Secondary | ICD-10-CM | POA: Diagnosis present

## 2022-05-19 DIAGNOSIS — O26893 Other specified pregnancy related conditions, third trimester: Secondary | ICD-10-CM | POA: Diagnosis present

## 2022-05-19 DIAGNOSIS — O0993 Supervision of high risk pregnancy, unspecified, third trimester: Principal | ICD-10-CM

## 2022-05-19 LAB — CBC
HCT: 30.6 % — ABNORMAL LOW (ref 36.0–46.0)
Hemoglobin: 9.9 g/dL — ABNORMAL LOW (ref 12.0–15.0)
MCH: 27.7 pg (ref 26.0–34.0)
MCHC: 32.4 g/dL (ref 30.0–36.0)
MCV: 85.7 fL (ref 80.0–100.0)
Platelets: 200 10*3/uL (ref 150–400)
RBC: 3.57 MIL/uL — ABNORMAL LOW (ref 3.87–5.11)
RDW: 14 % (ref 11.5–15.5)
WBC: 8 10*3/uL (ref 4.0–10.5)
nRBC: 0.3 % — ABNORMAL HIGH (ref 0.0–0.2)

## 2022-05-19 LAB — TYPE AND SCREEN
ABO/RH(D): A POS
Antibody Screen: NEGATIVE

## 2022-05-19 LAB — COMPREHENSIVE METABOLIC PANEL
ALT: 14 U/L (ref 0–44)
AST: 25 U/L (ref 15–41)
Albumin: 3.1 g/dL — ABNORMAL LOW (ref 3.5–5.0)
Alkaline Phosphatase: 159 U/L — ABNORMAL HIGH (ref 38–126)
Anion gap: 11 (ref 5–15)
BUN: 6 mg/dL (ref 6–20)
CO2: 17 mmol/L — ABNORMAL LOW (ref 22–32)
Calcium: 8.9 mg/dL (ref 8.9–10.3)
Chloride: 106 mmol/L (ref 98–111)
Creatinine, Ser: 0.75 mg/dL (ref 0.44–1.00)
GFR, Estimated: >60 mL/min (ref >60)
Glucose, Bld: 84 mg/dL (ref 70–99)
Potassium: 3.6 mmol/L (ref 3.5–5.1)
Sodium: 134 mmol/L — ABNORMAL LOW (ref 135–145)
Total Bilirubin: 0.2 mg/dL — ABNORMAL LOW (ref 0.3–1.2)
Total Protein: 6.8 g/dL (ref 6.5–8.1)

## 2022-05-19 MED ORDER — MEASLES, MUMPS & RUBELLA VAC IJ SOLR: 0.5000 mL | Freq: Once | INTRAMUSCULAR | Status: DC

## 2022-05-19 MED ORDER — IBUPROFEN 600 MG PO TABS
600.0000 mg | ORAL_TABLET | Freq: Four times a day (QID) | ORAL | Status: DC
  Administered 2022-05-19 – 2022-05-21 (×6): 600 mg via ORAL
  Filled 2022-05-19 (×7): qty 1

## 2022-05-19 MED ORDER — LIDOCAINE HCL (PF) 1 % IJ SOLN
INTRAMUSCULAR | Status: DC | PRN
  Administered 2022-05-19 (×2): 4 mL via EPIDURAL

## 2022-05-19 MED ORDER — OXYTOCIN-SODIUM CHLORIDE 30-0.9 UT/500ML-% IV SOLN
2.5000 [IU]/h | INTRAVENOUS | Status: DC
  Administered 2022-05-19: 2.5 [IU]/h via INTRAVENOUS
  Filled 2022-05-19: qty 500

## 2022-05-19 MED ORDER — TETANUS-DIPHTH-ACELL PERTUSSIS 5-2.5-18.5 LF-MCG/0.5 IM SUSY: 0.5000 mL | PREFILLED_SYRINGE | Freq: Once | INTRAMUSCULAR | Status: DC

## 2022-05-19 MED ORDER — DIBUCAINE (PERIANAL) 1 % EX OINT: 1.0000 | TOPICAL_OINTMENT | CUTANEOUS | Status: DC | PRN

## 2022-05-19 MED ORDER — ONDANSETRON HCL 4 MG PO TABS: 4.0000 mg | ORAL_TABLET | ORAL | Status: DC | PRN

## 2022-05-19 MED ORDER — SIMETHICONE 80 MG PO CHEW: 80.0000 mg | CHEWABLE_TABLET | ORAL | Status: DC | PRN

## 2022-05-19 MED ORDER — WITCH HAZEL-GLYCERIN EX PADS: 1.0000 | MEDICATED_PAD | CUTANEOUS | Status: DC | PRN

## 2022-05-19 MED ORDER — OXYTOCIN BOLUS FROM INFUSION
333.0000 mL | Freq: Once | INTRAVENOUS | Status: AC
  Administered 2022-05-19: 333 mL via INTRAVENOUS

## 2022-05-19 MED ORDER — LACTATED RINGERS IV SOLN: 500.0000 mL | INTRAVENOUS | Status: DC | PRN

## 2022-05-19 MED ORDER — SOD CITRATE-CITRIC ACID 500-334 MG/5ML PO SOLN: 30.0000 mL | ORAL | Status: DC | PRN

## 2022-05-19 MED ORDER — FENTANYL-BUPIVACAINE-NACL 0.5-0.125-0.9 MG/250ML-% EP SOLN
12.0000 mL/h | EPIDURAL | Status: DC | PRN
  Administered 2022-05-19: 12 mL/h via EPIDURAL
  Filled 2022-05-19: qty 250

## 2022-05-19 MED ORDER — ONDANSETRON HCL 4 MG/2ML IJ SOLN: 4.0000 mg | Freq: Four times a day (QID) | INTRAMUSCULAR | Status: DC | PRN

## 2022-05-19 MED ORDER — TERBUTALINE SULFATE 1 MG/ML IJ SOLN: 0.2500 mg | Freq: Once | INTRAMUSCULAR | Status: DC | PRN

## 2022-05-19 MED ORDER — MISOPROSTOL 50MCG HALF TABLET: 50.0000 ug | ORAL_TABLET | Freq: Once | ORAL | Status: DC

## 2022-05-19 MED ORDER — FENTANYL CITRATE (PF) 100 MCG/2ML IJ SOLN: 50.0000 ug | INTRAMUSCULAR | Status: DC | PRN

## 2022-05-19 MED ORDER — LIDOCAINE HCL (PF) 1 % IJ SOLN: 30.0000 mL | INTRAMUSCULAR | Status: DC | PRN

## 2022-05-19 MED ORDER — DIPHENHYDRAMINE HCL 50 MG/ML IJ SOLN: 12.5000 mg | INTRAMUSCULAR | Status: DC | PRN

## 2022-05-19 MED ORDER — PRENATAL MULTIVITAMIN CH
1.0000 | ORAL_TABLET | Freq: Every day | ORAL | Status: DC
  Administered 2022-05-20: 1 via ORAL
  Filled 2022-05-19 (×2): qty 1

## 2022-05-19 MED ORDER — ACETAMINOPHEN 325 MG PO TABS: 650.0000 mg | ORAL_TABLET | ORAL | Status: DC | PRN

## 2022-05-19 MED ORDER — PHENYLEPHRINE 80 MCG/ML (10ML) SYRINGE FOR IV PUSH (FOR BLOOD PRESSURE SUPPORT): 80.0000 ug | PREFILLED_SYRINGE | INTRAVENOUS | Status: DC | PRN

## 2022-05-19 MED ORDER — ONDANSETRON HCL 4 MG/2ML IJ SOLN: 4.0000 mg | INTRAMUSCULAR | Status: DC | PRN

## 2022-05-19 MED ORDER — LACTATED RINGERS IV SOLN
500.0000 mL | Freq: Once | INTRAVENOUS | Status: AC
  Administered 2022-05-19: 500 mL via INTRAVENOUS

## 2022-05-19 MED ORDER — COCONUT OIL OIL: 1.0000 | TOPICAL_OIL | Status: DC | PRN

## 2022-05-19 MED ORDER — EPHEDRINE 5 MG/ML INJ: 10.0000 mg | INTRAVENOUS | Status: DC | PRN

## 2022-05-19 MED ORDER — SODIUM CHLORIDE 0.9% FLUSH: 3.0000 mL | INTRAVENOUS | Status: DC | PRN

## 2022-05-19 MED ORDER — FENTANYL CITRATE (PF) 100 MCG/2ML IJ SOLN
INTRAMUSCULAR | Status: AC
  Filled 2022-05-19: qty 2

## 2022-05-19 MED ORDER — OXYCODONE-ACETAMINOPHEN 5-325 MG PO TABS: 1.0000 | ORAL_TABLET | ORAL | Status: DC | PRN

## 2022-05-19 MED ORDER — OXYTOCIN-SODIUM CHLORIDE 30-0.9 UT/500ML-% IV SOLN: 1.0000 m[IU]/min | INTRAVENOUS | Status: DC

## 2022-05-19 MED ORDER — BENZOCAINE-MENTHOL 20-0.5 % EX AERO: 1.0000 | INHALATION_SPRAY | CUTANEOUS | Status: DC | PRN

## 2022-05-19 MED ORDER — SODIUM CHLORIDE 0.9% FLUSH: 3.0000 mL | Freq: Two times a day (BID) | INTRAVENOUS | Status: DC

## 2022-05-19 MED ORDER — SENNOSIDES-DOCUSATE SODIUM 8.6-50 MG PO TABS
2.0000 | ORAL_TABLET | ORAL | Status: DC
  Administered 2022-05-20: 2 via ORAL
  Filled 2022-05-19 (×2): qty 2

## 2022-05-19 MED ORDER — MISOPROSTOL 25 MCG QUARTER TABLET: 25.0000 ug | ORAL_TABLET | Freq: Once | ORAL | Status: DC

## 2022-05-19 MED ORDER — SODIUM CHLORIDE 0.9 % IV SOLN: 250.0000 mL | INTRAVENOUS | Status: DC | PRN

## 2022-05-19 MED ORDER — FENTANYL CITRATE (PF) 100 MCG/2ML IJ SOLN
50.0000 ug | Freq: Once | INTRAMUSCULAR | Status: AC
  Administered 2022-05-19: 50 ug via INTRAVENOUS

## 2022-05-19 MED ORDER — FENTANYL CITRATE (PF) 100 MCG/2ML IJ SOLN
INTRAMUSCULAR | Status: DC | PRN
  Administered 2022-05-19 (×2): 50 ug via EPIDURAL

## 2022-05-19 MED ORDER — ACETAMINOPHEN 325 MG PO TABS
650.0000 mg | ORAL_TABLET | ORAL | Status: DC | PRN
  Administered 2022-05-20 – 2022-05-21 (×2): 650 mg via ORAL
  Filled 2022-05-19 (×2): qty 2

## 2022-05-19 MED ORDER — DIPHENHYDRAMINE HCL 25 MG PO CAPS: 25.0000 mg | ORAL_CAPSULE | Freq: Four times a day (QID) | ORAL | Status: DC | PRN

## 2022-05-19 MED ORDER — ZOLPIDEM TARTRATE 5 MG PO TABS: 5.0000 mg | ORAL_TABLET | Freq: Every evening | ORAL | Status: DC | PRN

## 2022-05-19 MED ORDER — LACTATED RINGERS IV SOLN: INTRAVENOUS | Status: DC

## 2022-05-19 NOTE — Anesthesia Preprocedure Evaluation (Signed)
Anesthesia Evaluation  Patient identified by MRN, date of birth, ID band Patient awake    Reviewed: Allergy & Precautions, H&P , NPO status , Patient's Chart, lab work & pertinent test results  History of Anesthesia Complications Negative for: history of anesthetic complications  Airway Mallampati: II  TM Distance: >3 FB     Dental   Pulmonary neg pulmonary ROS,    Pulmonary exam normal        Cardiovascular negative cardio ROS   Rhythm:regular Rate:Normal     Neuro/Psych negative neurological ROS  negative psych ROS   GI/Hepatic negative GI ROS, Neg liver ROS,   Endo/Other  Hyperthyroidism   Renal/GU negative Renal ROS  negative genitourinary   Musculoskeletal   Abdominal   Peds  Hematology  (+) Blood dyscrasia, anemia ,   Anesthesia Other Findings   Reproductive/Obstetrics (+) Pregnancy                             Anesthesia Physical Anesthesia Plan  ASA: 2  Anesthesia Plan: Epidural   Post-op Pain Management:    Induction:   PONV Risk Score and Plan:   Airway Management Planned:   Additional Equipment:   Intra-op Plan:   Post-operative Plan:   Informed Consent: I have reviewed the patients History and Physical, chart, labs and discussed the procedure including the risks, benefits and alternatives for the proposed anesthesia with the patient or authorized representative who has indicated his/her understanding and acceptance.       Plan Discussed with:   Anesthesia Plan Comments:         Anesthesia Quick Evaluation

## 2022-05-19 NOTE — Telephone Encounter (Signed)
Preadmission screen  

## 2022-05-19 NOTE — MAU Note (Signed)
Margaret Goodman is a 26 y.o. at [redacted]w[redacted]d here in MAU reporting: contractions started this morning and have progressed throughout the day. Scant amount of bleeding. No LOF. DFM.   Onset of complaint: today  Pain score: 10/10  Vitals:   05/19/22 1709  BP: 125/86  Pulse: 92  Resp: 16  Temp: 97.8 F (36.6 C)  SpO2: 95%     FHT:EFM applied in room  Lab orders placed from triage: none

## 2022-05-19 NOTE — Anesthesia Procedure Notes (Signed)
Epidural Patient location during procedure: OB Start time: 05/19/2022 6:34 PM End time: 05/19/2022 6:45 PM  Staffing Anesthesiologist: Lidia Collum, MD Performed: anesthesiologist   Preanesthetic Checklist Completed: patient identified, IV checked, risks and benefits discussed, monitors and equipment checked, pre-op evaluation and timeout performed  Epidural Patient position: sitting Prep: DuraPrep Patient monitoring: heart rate, continuous pulse ox and blood pressure Approach: midline Location: L3-L4 Injection technique: LOR air  Needle:  Needle type: Tuohy  Needle gauge: 17 G Needle length: 9 cm Needle insertion depth: 7 cm Catheter type: closed end flexible Catheter size: 19 Gauge Catheter at skin depth: 12 cm Test dose: negative  Assessment Events: blood not aspirated, injection not painful, no injection resistance, no paresthesia and negative IV test  Additional Notes Reason for block:procedure for pain

## 2022-05-19 NOTE — Discharge Summary (Signed)
Postpartum Discharge Summary  Date of Service updated: 05/21/22     Patient Name: Margaret Goodman DOB: 04/09/1996 MRN: [id number]  Date of admission: 05/19/2022 Delivery date:05/19/2022  Delivering provider: Concepcion Living  Date of discharge: 05/21/2022  Admitting diagnosis: Hyperthyroidism affecting pregnancy in third trimester [O99.283, E05.90] Intrauterine pregnancy: [redacted]w[redacted]d    Secondary diagnosis:  Principal Problem:   Hyperthyroidism affecting pregnancy in third trimester Active Problems:   Hyperthyroidism in pregnancy, antepartum   Supervision of high-risk pregnancy, third trimester   Vaginal delivery  Additional problems: none    Discharge diagnosis: Term Pregnancy Delivered                                              Post partum procedures: none Augmentation: AROM Complications: None  Hospital course: Onset of Labor With Vaginal Delivery      26y.o. yo [id number] 368w3das admitted in Active Labor on 05/19/2022. Patient had an uncomplicated labor course as follows:  Membrane Rupture Time/Date: 7:47 PM ,05/19/2022   Delivery Method:Vaginal, Spontaneous  Episiotomy: None  Lacerations:  None  Patient had an uncomplicated postpartum course.  She is ambulating, tolerating a regular diet, passing flatus, and urinating well. Patient is discharged home in stable condition on 05/21/22.  Newborn Data: Birth date:05/19/2022  Birth time:8:09 PM  Gender:Female  Living status:Living  Apgars:9 ,9  Weight:3210 g   Magnesium Sulfate received: No BMZ received: No Rhophylac:No MMR:No T-DaP:Given prenatally Flu: No Transfusion:No  Physical exam  Vitals:   05/20/22 0940 05/20/22 1230 05/20/22 2110 05/21/22 0551  BP: 104/71 117/72 (!) 105/55 104/65  Pulse: 78 66 (!) 57 (!) 57  Resp: 18 16 17 17   Temp: 98.2 F (36.8 C) 98.2 F (36.8 C) (!) 97.4 F (36.3 C) 97.9 F (36.6 C)  TempSrc: Oral Oral Oral Oral  SpO2: 100% 100% 100% 100%   General: alert, cooperative, and no  distress Lochia: appropriate Uterine Fundus: firm Incision: Healing well with no significant drainage DVT Evaluation: No evidence of DVT seen on physical exam. Labs: Lab Results  Component Value Date   WBC 11.0 (H) 05/20/2022   HGB 9.1 (L) 05/20/2022   HCT 28.1 (L) 05/20/2022   MCV 85.7 05/20/2022   PLT 164 05/20/2022      Latest Ref Rng & Units 05/19/2022    5:58 PM  CMP  Glucose 70 - 99 mg/dL 84   BUN 6 - 20 mg/dL 6   Creatinine 0.44 - 1.00 mg/dL 0.75   Sodium 135 - 145 mmol/L 134   Potassium 3.5 - 5.1 mmol/L 3.6   Chloride 98 - 111 mmol/L 106   CO2 22 - 32 mmol/L 17   Calcium 8.9 - 10.3 mg/dL 8.9   Total Protein 6.5 - 8.1 g/dL 6.8   Total Bilirubin 0.3 - 1.2 mg/dL 0.2   Alkaline Phos 38 - 126 U/L 159   AST 15 - 41 U/L 25   ALT 0 - 44 U/L 14    Edinburgh Score:    05/20/2022    3:00 PM  Edinburgh Postnatal Depression Scale Screening Tool  I have been able to laugh and see the funny side of things. 0  I have looked forward with enjoyment to things. 0  I have blamed myself unnecessarily when things went wrong. 1  I have been anxious or worried for no  good reason. 0  I have felt scared or panicky for no good reason. 0  Things have been getting on top of me. 0  I have been so unhappy that I have had difficulty sleeping. 0  I have felt sad or miserable. 0  I have been so unhappy that I have been crying. 0  The thought of harming myself has occurred to me. 0  Edinburgh Postnatal Depression Scale Total 1     After visit meds:  Allergies as of 05/21/2022       Reactions   Tamiflu [oseltamivir Phosphate] Hives        Medication List     TAKE these medications    ibuprofen 600 MG tablet Commonly known as: ADVIL Take 1 tablet (600 mg total) by mouth every 6 (six) hours.   metoCLOPramide 10 MG tablet Commonly known as: REGLAN Take 1 tablet (10 mg total) by mouth every 6 (six) hours.   multivitamin-prenatal 27-0.8 MG Tabs tablet Take 1 tablet by mouth daily  at 12 noon.   pantoprazole 40 MG tablet Commonly known as: PROTONIX TAKE 1 TABLET(40 MG) BY MOUTH DAILY   polyethylene glycol 17 g packet Commonly known as: MIRALAX / GLYCOLAX Take 17 g by mouth daily.   prochlorperazine 10 MG tablet Commonly known as: COMPAZINE Take 10 mg by mouth every 6 (six) hours as needed for nausea or vomiting.   scopolamine 1 MG/3DAYS Commonly known as: TRANSDERM-SCOP Place 1 patch (1.5 mg total) onto the skin every 3 (three) days.         Discharge home in stable condition Infant Feeding: Breast Infant Disposition:home with mother Discharge instruction: per After Visit Summary and Postpartum booklet. Activity: Advance as tolerated. Pelvic rest for 6 weeks.  Diet: routine diet Future Appointments: Future Appointments  Date Time Provider [place]  05/22/2022  9:15 AM [facility] NST [facility]  05/22/2022 10:15 AM Caren Macadam, MD [facility]   Follow up Visit:  [place] [facility] [place] [facility] Follow up.   Specialty: Obstetrics and Gynecology Why: In 1 week for blood pressure check, In 4-6 weeks for postpartum visit Contact information: [place] [phone]                Message sent 05/19/22  Please schedule this patient for a In person postpartum visit in 4 weeks with the following provider: Any provider. Additional Postpartum F/U: none   High risk pregnancy complicated by:  Hyperthyroidism  Delivery mode:  Vaginal, Spontaneous  Anticipated Birth Control:  POPs   05/21/2022 Fatima Blank, CNM

## 2022-05-19 NOTE — H&P (Addendum)
OBSTETRIC ADMISSION HISTORY AND PHYSICAL  Shiza Provencal is a 26 y.o. female [phone] with IUP at [redacted]w[redacted]d presenting for SOL. She reports +FMs. No LOF, VB, blurry vision, headaches, peripheral edema, or RUQ pain. She plans on breastfeeding. She requests undecided for birth control.  Dating: By LMP --->  Estimated Date of Delivery: 05/30/22  Sono:  05/08/22  @[redacted]w[redacted]d , normal anatomy, cephalic presentation, [id number], 45%ile, EFW 5lb 4oz  Prenatal History/Complications:  Elevated BP  Past Medical History: Past Medical History:  Diagnosis Date   Depression    dr had conversation today about therapy before medicine   Hyperlipidemia    Hyperthyroidism    Hypotension    UTI (urinary tract infection)    Vaginal Pap smear, abnormal    ok since    Past Surgical History: Past Surgical History:  Procedure Laterality Date   DILATION AND CURETTAGE OF UTERUS     NO PAST SURGERIES      Obstetrical History: OB History     Gravida  4   Para  1   Term  1   Preterm      AB  2   Living  1      SAB  0   IAB  2   Ectopic      Multiple  0   Live Births  1           Social History: Social History   Socioeconomic History   Marital status: Single    Spouse name: Not on file   Number of children: Not on file   Years of education: Not on file   Highest education level: Not on file  Occupational History   Not on file  Tobacco Use   Smoking status: Never   Smokeless tobacco: Never  Vaping Use   Vaping Use: Never used  Substance and Sexual Activity   Alcohol use: Not Currently    Comment: ocassionally   Drug use: No   Sexual activity: Yes    Birth control/protection: None  Other Topics Concern   Not on file  Social History Narrative   Not on file   Social Determinants of Health   Financial Resource Strain: Not on file  Food Insecurity: Not on file  Transportation Needs: Not on file  Physical Activity: Not on file  Stress: Not on file  Social Connections: Not  on file    Family History: Family History  Problem Relation Age of Onset   Healthy Mother    Healthy Father    Thyroid disease Neg Hx     Allergies: Allergies  Allergen Reactions   Tamiflu [Oseltamivir Phosphate] Hives    Medications Prior to Admission  Medication Sig Dispense Refill Last Dose   metoCLOPramide (REGLAN) 10 MG tablet Take 1 tablet (10 mg total) by mouth every 6 (six) hours. 30 tablet 3 Past Week   pantoprazole (PROTONIX) 40 MG tablet TAKE 1 TABLET(40 MG) BY MOUTH DAILY 90 tablet 2 05/19/2022   polyethylene glycol (MIRALAX / GLYCOLAX) 17 g packet Take 17 g by mouth daily.   Past Month   Prenatal Vit-Fe Fumarate-FA (MULTIVITAMIN-PRENATAL) 27-0.8 MG TABS tablet Take 1 tablet by mouth daily at 12 noon.   05/19/2022   prochlorperazine (COMPAZINE) 10 MG tablet Take 10 mg by mouth every 6 (six) hours as needed for nausea or vomiting.   05/19/2022   scopolamine (TRANSDERM-SCOP) 1 MG/3DAYS Place 1 patch (1.5 mg total) onto the skin every 3 (three) days. 10 patch 6 Past Week  Review of Systems:  All systems reviewed and negative except as stated in HPI  PE: Blood pressure 125/86, pulse 92, temperature 97.8 F (36.6 C), temperature source Oral, resp. rate 16, last menstrual period 08/17/2021, SpO2 95 %, unknown if currently breastfeeding. General appearance: alert Lungs: regular rate and effort Heart: regular rate  Abdomen: soft, non-tender Extremities: Homans sign is negative, no sign of DVT Presentation: cephalic EFM: 563 bpm/Moderate variability/ 15x15 accels/ None decels  Toco: q4 Dilation: 5 Effacement (%): 70, 80 Station: -2 Exam by:: felicia morris rn SVE: N/A  Prenatal labs: ABO, Rh: A/Positive/-- (03/28 1129) Antibody: Negative (03/28 1129) Rubella: 2.62 (03/28 1129) RPR: Non Reactive (08/08 0937)  HBsAg: Negative (03/28 1129)  HIV: Non Reactive (08/08 0937)  GBS: Negative/-- (09/18 1059)  2 hr GTT nml  Prenatal Transfer Tool  Maternal  Diabetes: No Genetic Screening: Normal Maternal Ultrasounds/Referrals: Normal Fetal Ultrasounds or other Referrals:  None Maternal Substance Abuse:  No Significant Maternal Medications:  None Significant Maternal Lab Results: Group B Strep negative  No results found for this or any previous visit (from the past 24 hour(s)).  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 G4P1021 at [redacted]w[redacted]d here for SOL.  1. Labor: Came in active labor, progressing rapidly.  2. FWB: Cat 1 3. Pain: Desires epidural, attempting access prior to suspected precipitous delivery 4. GBS: negative 5. POPs  6 Boy  Plan: Admit to labor and delivery  Deloria Lair, DO  05/19/2022, 5:41 PM   Fellow Attestation  I saw and evaluated the patient, performing the key elements of the service.I  personally performed or re-performed the history, physical exam, and medical decision making activities of this service and have verified that the service and findings are accurately documented in the resident's note. I developed the management plan that is described in the resident's note, and I agree with the content, with my edits above.    Gifford Shave, MD OB Fellow

## 2022-05-20 LAB — CBC
HCT: 28.1 % — ABNORMAL LOW (ref 36.0–46.0)
Hemoglobin: 9.1 g/dL — ABNORMAL LOW (ref 12.0–15.0)
MCH: 27.7 pg (ref 26.0–34.0)
MCHC: 32.4 g/dL (ref 30.0–36.0)
MCV: 85.7 fL (ref 80.0–100.0)
Platelets: 164 10*3/uL (ref 150–400)
RBC: 3.28 MIL/uL — ABNORMAL LOW (ref 3.87–5.11)
RDW: 13.9 % (ref 11.5–15.5)
WBC: 11 10*3/uL — ABNORMAL HIGH (ref 4.0–10.5)
nRBC: 0 % (ref 0.0–0.2)

## 2022-05-20 LAB — RPR: RPR Ser Ql: NONREACTIVE

## 2022-05-20 NOTE — Progress Notes (Signed)
POSTPARTUM PROGRESS NOTE  Post Partum Day 1  Subjective:  Margaret Goodman is a 26 y.o. F1M3846 s/p VD at [redacted]w[redacted]d.  No acute events overnight.  Pt denies problems with ambulating, voiding or po intake.  She denies nausea or vomiting.  Pain is well controlled.  She has had flatus. She has not had bowel movement.  Lochia Small.   Objective: Blood pressure 109/60, pulse 80, temperature 98 F (36.7 C), resp. rate 16, last menstrual period 08/17/2021, SpO2 99 %, unknown if currently breastfeeding.  Physical Exam:  General: alert, cooperative and no distress Chest: no respiratory distress Heart:regular rate, distal pulses intact Abdomen: soft, nontender,  Uterine Fundus: firm, appropriately tender DVT Evaluation: No calf swelling or tenderness Extremities: no peripheral edema Skin: warm, dry  Recent Labs    05/19/22 1758  HGB 9.9*  HCT 30.6*    Assessment/Plan: Margaret Goodman is a 26 y.o. K5L9357 s/p VD at [redacted]w[redacted]d.   PPD#1 - Doing well Contraception: OCPs Feeding: breast Dispo: Plan for discharge tomorrow.   LOS: 1 day   Salvadore Oxford, MD, PGY-1 [facility] 1:04 AM 05/20/2022

## 2022-05-20 NOTE — Lactation Note (Signed)
This note was copied from a baby's chart. Lactation Consultation Note  Patient Name: Margaret Goodman [name] Date: 05/20/2022 Reason for consult: Initial assessment;Early term 37-38.6wks Age:26 hours   P2: Early term infant at 38+3 weeks Feeding preference: Breast  Breast feeding basics reviewed with birth parent.  Taught hand expression and birth parent able to express colostrum drops.  Demonstrated finger feeding/spoon feeding and encouraged parent to feed back any expressed drops obtained. Last LATCH score was an 8; baby has voided/stooled.  Birth parent will feed 8-12 times/24 hours or sooner if baby shows cues.  Suggested she call her RN/LC for latch assistance as needed.  Support person present.   Maternal Data Has patient been taught Hand Expression?: Yes Does the patient have breastfeeding experience prior to this delivery?: Yes How long did the patient breastfeed?: 3 years  Feeding Mother's Current Feeding Choice: Breast Milk  LATCH Score                    Lactation Tools Discussed/Used    Interventions Interventions: Breast feeding basics reviewed;Education  Discharge Pump: Personal  Consult Status Consult Status: Follow-up Date: 05/21/22 Follow-up type: In-patient    Little Ishikawa 05/20/2022, 8:49 AM

## 2022-05-20 NOTE — Anesthesia Postprocedure Evaluation (Signed)
Anesthesia Post Note  Patient: Margaret Goodman  Procedure(s) Performed: AN AD HOC LABOR EPIDURAL     Anesthesia Post Evaluation No notable events documented.  Last Vitals:  Vitals:   05/20/22 0940 05/20/22 1230  BP: 104/71 117/72  Pulse: 78 66  Resp: 18 16  Temp: 36.8 C 36.8 C  SpO2: 100% 100%    Last Pain:  Vitals:   05/20/22 1230  TempSrc: Oral  PainSc: 0-No pain   Pain Goal:                   Ailene Ards

## 2022-05-20 NOTE — Progress Notes (Deleted)
Attending Circumcision Counseling Progress Note  Patient desires circumcision for her female infant.  Circumcision procedure details discussed, risks and benefits of procedure were also discussed.  These include but are not limited to: Benefits of circumcision in men include reduction in the rates of urinary tract infection (UTI), penile cancer, some sexually transmitted infections, penile inflammatory and retractile disorders, as well as easier hygiene.  Risks include bleeding , infection, injury of glans which may lead to penile deformity or urinary tract issues, unsatisfactory cosmetic appearance and other potential complications related to the procedure.  It was emphasized that this is an elective procedure.  Patient wants to proceed with circumcision; written informed consent obtained.  Will do circumcision soon, routine circumcision and post circumcision care ordered for the infant.  Margaret Goodman, M.D. 05/20/2022 10:22 AM    

## 2022-05-21 MED ORDER — IBUPROFEN 600 MG PO TABS: 600.0000 mg | ORAL_TABLET | Freq: Four times a day (QID) | ORAL | 0 refills | Status: DC

## 2022-05-21 NOTE — Progress Notes (Signed)
CSW received consult for hx of Depression.  CSW met with MOB to offer support and complete assessment. When CSW entered room, FOB was present holding and bonding with infant "Nolan." CSW requested to speak with MOB alone, MOB provided verbal permission to speak about anything in front of FOB. CSW introduced self and reason for consult. MOB welcomed CSW visit and remained engaged throughout encounter.   CSW inquired how MOB has felt emotionally since giving birth. MOB reports she feels "good just tired." CSW inquired about MOB's history of depression. MOB reports she experienced depression during her last pregnancy 1 year ago due to having "a lot of health complications", which was terminated last October. MOB became tearful while sharing. CSW expressed condolences and validated MOB's feelings of grief. MOB denied additional depression symptoms and denied experiencing depression and/or anxiety symptoms during current pregnancy. MOB reports she has not taken medication or attended therapy in the past for management of mental health symptoms. MOB declined mental health resources at this time. MOB denied current SI/HI. DV was not assessed due to FOB being present.   MOB reports she has all needed items for infant, including a car seat and bassinet. MOB has chosen [facility] as infant's pediatrician office. MOB reports she receives WIC. CSW encouraged MOB to notify her caseworker of infant's birth to have infant added to benefits. MOB reports she has an upcoming WIC appointment.  CSW provided education regarding the baby blues period vs. perinatal mood disorders, discussed treatment and offered resources for mental health follow up if concerns arise.  CSW recommends self-evaluation during the postpartum time period using the New Mom Checklist from Postpartum Progress and encouraged MOB to contact a medical professional if symptoms are noted at any time.    CSW provided review of Sudden Infant Death  Syndrome (SIDS) precautions.    CSW identifies no further need for intervention and no barriers to discharge at this time.  Signed,  Melis Trochez K. Mead Slane, MSW, LCSWA, LCASA 05/21/2022 1:10 PM 

## 2022-05-21 NOTE — Lactation Note (Signed)
This note was copied from a baby's chart. Lactation Consultation Note  Patient Name: Boy Sherrilyn Nairn [id number] Date: 05/21/2022 Reason for consult: Follow-up assessment Age:26 hours   P2: Early term infant at 38+3 weeks Feeding preference: Breast  Discharge teaching completed; answered birth parent's questions.  LATCH score 9; multiple voids/stools.  Family has our op phone number for any further questions/concerns.  Support person present.   Maternal Data    Feeding    LATCH Score                    Lactation Tools Discussed/Used    Interventions    Discharge Discharge Education: Engorgement and breast care Pump: Personal  Consult Status Consult Status: Complete Follow-up type: Call as needed    Talayla Doyel R Elishia Kaczorowski 05/21/2022, 11:51 AM

## 2022-05-22 ENCOUNTER — Encounter: Payer: Self-pay | Admitting: Family Medicine

## 2022-05-22 ENCOUNTER — Other Ambulatory Visit: Payer: 59

## 2022-05-25 ENCOUNTER — Inpatient Hospital Stay (HOSPITAL_COMMUNITY): Admission: AD | Admit: 2022-05-25 | Payer: 59 | Source: Home / Self Care | Admitting: Obstetrics and Gynecology

## 2022-05-25 ENCOUNTER — Inpatient Hospital Stay (HOSPITAL_COMMUNITY): Payer: 59

## 2022-05-27 ENCOUNTER — Telehealth (HOSPITAL_COMMUNITY): Payer: Self-pay | Admitting: *Deleted

## 2022-05-27 NOTE — Telephone Encounter (Signed)
Attempted hospital discharge follow-up call. Left message for patient to return RN call with any questions or concerns. Erline Levine, RN, 05/27/22, [phone]

## 2022-05-29 ENCOUNTER — Encounter: Payer: Self-pay | Admitting: Obstetrics and Gynecology

## 2022-06-05 ENCOUNTER — Encounter: Payer: Self-pay | Admitting: Obstetrics and Gynecology

## 2022-06-05 ENCOUNTER — Other Ambulatory Visit: Payer: Self-pay | Admitting: *Deleted

## 2022-06-22 ENCOUNTER — Ambulatory Visit (INDEPENDENT_AMBULATORY_CARE_PROVIDER_SITE_OTHER): Payer: 59 | Admitting: Obstetrics and Gynecology

## 2022-06-22 ENCOUNTER — Other Ambulatory Visit (HOSPITAL_COMMUNITY)
Admission: RE | Admit: 2022-06-22 | Discharge: 2022-06-22 | Disposition: A | Discharge status: Home / Self Care | Payer: 59 | Source: Ambulatory Visit | Attending: Obstetrics and Gynecology | Admitting: Obstetrics and Gynecology

## 2022-06-22 ENCOUNTER — Encounter: Payer: Self-pay | Admitting: Obstetrics and Gynecology

## 2022-06-22 ENCOUNTER — Other Ambulatory Visit: Payer: Self-pay

## 2022-06-22 DIAGNOSIS — Z124 Encounter for screening for malignant neoplasm of cervix: Secondary | ICD-10-CM | POA: Insufficient documentation

## 2022-06-22 DIAGNOSIS — Z30011 Encounter for initial prescription of contraceptive pills: Secondary | ICD-10-CM

## 2022-06-22 MED ORDER — SLYND 4 MG PO TABS: 1.0000 | ORAL_TABLET | Freq: Every day | ORAL | 11 refills | Status: DC

## 2022-06-22 NOTE — Progress Notes (Signed)
Post Partum Visit Note  Margaret Goodman is a 26 y.o. [phone] female who presents for a postpartum visit. She is 4 weeks 6 days postpartum following a normal spontaneous vaginal delivery.  I have fully reviewed the prenatal and intrapartum course. The delivery was at 38 gestational weeks and 3 days.  Anesthesia: epidural. Postpartum course has been good. Baby is doing well. Baby is feeding by breast. Bleeding staining only. Bowel function is normal. Bladder function is normal. Patient is not sexually active. Contraception method is none. Postpartum depression screening: negative.   The pregnancy intention screening data noted above was reviewed. Potential methods of contraception were discussed. The patient elected to proceed with POPs.   Edinburgh Postnatal Depression Scale - 06/22/22 1059       Edinburgh Postnatal Depression Scale:  In the Past 7 Days   I have been able to laugh and see the funny side of things. 0    I have looked forward with enjoyment to things. 0    I have blamed myself unnecessarily when things went wrong. 0    I have been anxious or worried for no good reason. 2    I have felt scared or panicky for no good reason. 0    Things have been getting on top of me. 0    I have been so unhappy that I have had difficulty sleeping. 0    I have felt sad or miserable. 0    I have been so unhappy that I have been crying. 0    The thought of harming myself has occurred to me. 0    Edinburgh Postnatal Depression Scale Total 2             Health Maintenance Due  Topic Date Due   COVID-19 Vaccine (1) Never done   HPV VACCINES (1 - 2-dose series) Never done   INFLUENZA VACCINE  03/21/2022   PAP-Cervical Cytology Screening  06/09/2022   PAP SMEAR-Modifier  06/09/2022    The following portions of the patient's history were reviewed and updated as appropriate: allergies, current medications, past family history, past medical history, past social history, past surgical  history, and problem list.  Review of Systems Pertinent items are noted in HPI.  Objective:  BP 115/71   Pulse 86   Ht 5\' 8"  (1.727 m)   Wt 223 lb (101.2 kg)   LMP 08/17/2021   Breastfeeding Yes   BMI 33.91 kg/m    General:  alert, cooperative, and no distress   Breasts:  not indicated  Lungs: clear to auscultation bilaterally  Heart:  regular rate and rhythm  Abdomen: soft, non-tender; bowel sounds normal; no masses,  no organomegaly   Wound N/a  GU exam:  normal       Assessment:    Encounter postpartum care normal postpartum exam.   Plan:   Essential components of care per ACOG recommendations:  1.  Mood and well being: Patient with negative depression screening today. Reviewed local resources for support.  - Patient tobacco use? No.   - hx of drug use? No.    2. Infant care and feeding:  -Patient currently breastmilk feeding? Yes. Reviewed importance of draining breast regularly to support lactation.  -Social determinants of health (SDOH) reviewed in EPIC. No concerns.  3. Sexuality, contraception and birth spacing - Patient does not want a pregnancy in the next year.  Desired family size is 2 children.  - Reviewed reproductive life planning. Reviewed contraceptive methods  based on pt preferences and effectiveness.  Patient desired Oral Contraceptive today.  [place] - Discussed birth spacing of 18 months  4. Sleep and fatigue -Encouraged family/partner/community support of 4 hrs of uninterrupted sleep to help with mood and fatigue  5. Physical Recovery  - Discussed patients delivery and complications. She describes her labor as good. - Patient had a Vaginal, no problems at delivery. Patient had no lacerations. Perineal healing reviewed. Patient expressed understanding - Patient has urinary incontinence? No. - Patient is safe to resume physical and sexual activity  6.  Health Maintenance - HM due items addressed Yes - Last pap smear  Diagnosis  Date Value  Ref Range Status  06/10/2019      - Negative for intraepithelial lesion or malignancy (NILM)   Pap smear done at today's visit.  -Breast Cancer screening indicated? No.   7. Chronic Disease/Pregnancy Condition follow up: Hyperthyroidism Pt will follow up with her endocrinologist - PCP follow up  Griffin Basil, [place] for [place], [place]

## 2022-06-23 LAB — CYTOLOGY - PAP
Comment: NEGATIVE
Diagnosis: NEGATIVE
High risk HPV: NEGATIVE

## 2022-07-31 ENCOUNTER — Encounter: Payer: Self-pay | Admitting: *Deleted

## 2022-08-03 ENCOUNTER — Encounter: Payer: Self-pay | Admitting: *Deleted

## 2023-01-28 ENCOUNTER — Ambulatory Visit (HOSPITAL_COMMUNITY)
Admission: EM | Admit: 2023-01-28 | Discharge: 2023-01-28 | Disposition: A | Discharge status: Home / Self Care | Payer: 59 | Attending: Internal Medicine | Admitting: Internal Medicine

## 2023-01-28 ENCOUNTER — Encounter (HOSPITAL_COMMUNITY): Payer: Self-pay

## 2023-01-28 DIAGNOSIS — J020 Streptococcal pharyngitis: Secondary | ICD-10-CM | POA: Diagnosis not present

## 2023-01-28 LAB — POCT RAPID STREP A (OFFICE): Rapid Strep A Screen: POSITIVE — AB

## 2023-01-28 MED ORDER — IBUPROFEN 800 MG PO TABS
ORAL_TABLET | ORAL | Status: AC
  Filled 2023-01-28: qty 1

## 2023-01-28 MED ORDER — IBUPROFEN 800 MG PO TABS
800.0000 mg | ORAL_TABLET | Freq: Once | ORAL | Status: AC
  Administered 2023-01-28: 800 mg via ORAL

## 2023-01-28 MED ORDER — AMOXICILLIN 500 MG PO CAPS: 500.0000 mg | ORAL_CAPSULE | Freq: Two times a day (BID) | ORAL | 0 refills | Status: AC

## 2023-01-28 NOTE — Discharge Instructions (Signed)
You have strep throat.  - Take the prescribed antibiotic as directed for the next 10 days. - Take ibuprofen 600 mg every 6 hours as needed for throat pain and fever.  - Change your toothbrush after 2 to 3 days of antibiotic use to prevent reinfection. - You may also gargle with salt water and drink warm tea with honey to soothe your throat.  If you develop any new or worsening symptoms or do not improve in the next 2 to 3 days, please return.  If your symptoms are severe, please go to the emergency room.  Follow-up with your primary care provider for further evaluation and management of your symptoms as well as ongoing wellness visits.  I hope you feel better! 

## 2023-01-28 NOTE — ED Triage Notes (Signed)
Patient here today with c/o ST, body aches, and headache X 3 days. Hard to swallow. She has tried taking Theraflu, IBU, Excedrin, and Cepacol with some relief for a few hours but it comes back.

## 2023-01-28 NOTE — ED Provider Notes (Signed)
[facility]    CSN: [id number] Arrival date & time: 01/28/23  1005      History   Chief Complaint Chief Complaint  Patient presents with   Sore Throat    HPI Margaret Goodman is a 27 y.o. female.   Patient presents to urgent care for evaluation of sore throat, body aches, chills, and generalized headache for the last 3 days.  She has an 102-month-old at home and is breast-feeding.  19-month-old attends daycare.  No recent known sick contacts with similar symptoms.  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  Unsure of max temp at home.  Sore throat is worse with swallowing and eating.  No recent antibiotic/steroid use.  Denies nasal congestion, vision changes, nausea, vomiting, diarrhea, abdominal pain, cough, and rash.  She has been using TheraFlu, Motrin, and other over-the-counter medications with some relief temporarily while medications are active in her system.  She last took TheraFlu this morning at 9:30 AM 2 hours prior to arrival urgent care.  She has not had any Motrin this morning.  Currently febrile at 101.2.   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 Authorizing Provider  amoxicillin (AMOXIL) 500 MG capsule Take 1 capsule (500 mg total) by mouth 2 (two) times daily for 10 days. 01/28/23 02/07/23 Yes Aseneth Hack, Donavan Burnet, FNP  Drospirenone (SLYND) 4 MG TABS Take 1 tablet by mouth daily. 06/22/22  Yes Warden Fillers, MD  ibuprofen (ADVIL) 600 MG tablet Take 1 tablet (600 mg total) by mouth every 6 (six) hours. 05/21/22  Yes Leftwich-Kirby, Wilmer Floor, CNM  pantoprazole (PROTONIX) 40 MG tablet TAKE 1 TABLET(40 MG) BY MOUTH DAILY 05/08/22  Yes Warden Fillers, MD  metoCLOPramide (REGLAN) 10 MG tablet Take 1 tablet (10 mg total) by mouth every 6 (six) hours. Patient not taking: Reported on 06/22/2022 05/15/21   Rolm Bookbinder, CNM  polyethylene glycol (MIRALAX / GLYCOLAX) 17 g packet Take 17 g by mouth daily.    [provider]  Prenatal Vit-Fe Fumarate-FA (MULTIVITAMIN-PRENATAL) 27-0.8 MG TABS  tablet Take 1 tablet by mouth daily at 12 noon. Patient not taking: Reported on 06/22/2022    [provider]  prochlorperazine (COMPAZINE) 10 MG tablet Take 10 mg by mouth every 6 (six) hours as needed for nausea or vomiting. Patient not taking: Reported on 06/22/2022    [provider]  scopolamine (TRANSDERM-SCOP) 1 MG/3DAYS Place 1 patch (1.5 mg total) onto the skin every 3 (three) days. Patient not taking: Reported on 06/22/2022 05/08/22   Warden Fillers, MD    Family History Family History  Problem Relation Age of Onset   Healthy Mother    Healthy Father    Thyroid disease Neg Hx     Social History Social History   Tobacco Use   Smoking status: Never   Smokeless tobacco: Never  Vaping Use   Vaping Use: Never used  Substance Use Topics   Alcohol use: Not Currently     Comment: ocassionally   Drug use: No     Allergies   Tamiflu [oseltamivir phosphate]   Review of Systems Review of Systems Per HPI  Physical Exam Triage Vital Signs ED Triage Vitals  Enc Vitals Group     BP 01/28/23 1032 106/70     Pulse Rate 01/28/23 1032 (!) 105     Resp 01/28/23 1032 16     Temp 01/28/23 1032 (!) 101.2 F (38.4 C)     Temp Source 01/28/23 1032 Oral     SpO2 01/28/23 1032 95 %     Weight 01/28/23 1032 230 lb (104.3 kg)     Height 01/28/23 1032 5\' 8"  (1.727 m)     Head Circumference --      Peak Flow --      Pain Score 01/28/23 1031 2     Pain Loc --      Pain Edu? --      Excl. in GC? --    No data found.  Updated Vital Signs BP 106/70 (BP Location: Left Arm)   Pulse (!) 105   Temp (!) 101.2 F (38.4 C) (Oral)   Resp 16   Ht 5\' 8"  (1.727 m)   Wt 230 lb (104.3 kg)   SpO2 95%   Breastfeeding Yes   BMI 34.97 kg/m   Visual Acuity Right Eye Distance:   Left Eye Distance:   Bilateral Distance:    Right Eye Near:   Left Eye Near:    Bilateral Near:     Physical Exam Vitals and nursing note reviewed.  Constitutional:      Appearance: She is not ill-appearing or toxic-appearing.  HENT:     Head: Normocephalic and atraumatic.     Right Ear: Hearing, tympanic membrane, ear canal and external ear normal.     Left Ear: Hearing, tympanic membrane, ear canal and external ear normal.     Nose: Nose normal.     Mouth/Throat:     Lips: Pink.     Mouth: Mucous membranes are moist. No injury.     Tongue: No lesions. Tongue does not deviate from midline.     Palate: No mass and lesions.     Pharynx: Uvula midline. Pharyngeal swelling and posterior oropharyngeal erythema present. No oropharyngeal exudate or uvula swelling.     Tonsils: Tonsillar exudate present. No tonsillar abscesses. 3+ on the right. 3+ on the left.     Comments: Maintaining secretions without drooling or difficulty.  Voice sounds are normal and not muffled. Eyes:  General: Lids are normal. Vision grossly intact. Gaze aligned appropriately.     Extraocular Movements: Extraocular movements intact.     Conjunctiva/sclera: Conjunctivae normal.  Cardiovascular:     Rate and Rhythm: Normal rate and regular rhythm.     Heart sounds: Normal heart sounds, S1 normal and S2 normal.  Pulmonary:     Effort: Pulmonary effort is normal. No respiratory distress.     Breath sounds: Normal breath sounds and air entry.  Musculoskeletal:     Cervical back: Neck supple.  Lymphadenopathy:     Cervical: Cervical adenopathy present.     Right cervical: Superficial cervical adenopathy and deep cervical adenopathy present.     Left cervical: Superficial cervical adenopathy and deep cervical adenopathy present.  Skin:    General: Skin is warm and dry.     Capillary Refill: Capillary refill takes less than 2 seconds.     Findings: No rash.  Neurological:     General: No focal deficit present.     Mental Status: She is alert and oriented to person, place, and time. Mental status is at baseline.     Cranial Nerves: No dysarthria or facial asymmetry.  Psychiatric:        Mood and Affect: Mood normal.        Speech: Speech normal.        Behavior: Behavior normal.        Thought Content: Thought content normal.        Judgment: Judgment normal.      UC Treatments / Results  Labs (all labs ordered are listed, but only abnormal results are displayed) Labs Reviewed  POCT RAPID STREP A (OFFICE) - Abnormal; Notable for the following components:      Result Value   Rapid Strep A Screen Positive (*)    All other components within normal limits    EKG   Radiology No results found.  Procedures Procedures (including critical care time)  Medications Ordered in UC Medications  ibuprofen (ADVIL) tablet 800 mg (800 mg Oral Given 01/28/23 1126)    Initial Impression / Assessment and Plan / UC Course  I have reviewed the triage vital signs and the nursing  notes.  Pertinent labs & imaging results that were available during my care of the patient were reviewed by me and considered in my medical decision making (see chart for details).   1.  Strep pharyngitis POC strep testing is positive.  Amoxicillin sent to pharmacy to be taken twice daily for the next 10 days with food.  No allergies to antibiotics.  Ibuprofen and Tylenol may be used every 6 hours as needed for throat pain and fever/chills.  Advised to change toothbrush after 2 to 3 days of antibiotic use to prevent reinfection.  Salt water gargles and warm tea with honey may be used as needed to soothe sore throat.   Discussed physical exam and available lab work findings in clinic with patient.  Counseled patient regarding appropriate use of medications and potential side effects for all medications recommended or prescribed today. Discussed red flag signs and symptoms of worsening condition,when to call the PCP office, return to urgent care, and when to seek higher level of care in the emergency department. Patient verbalizes understanding and agreement with plan. All questions answered. Patient discharged in stable condition.    Final Clinical Impressions(s) / UC Diagnoses   Final diagnoses:  Strep pharyngitis     Discharge Instructions      You have  strep throat.  - Take the prescribed antibiotic as directed for the next 10 days. - Take ibuprofen 600 mg every 6 hours as needed for throat pain and fever.  - Change your toothbrush after 2 to 3 days of antibiotic use to prevent reinfection. - You may also gargle with salt water and drink warm tea with honey to soothe your throat.  If you develop any new or worsening symptoms or do not improve in the next 2 to 3 days, please return.  If your symptoms are severe, please go to the emergency room.  Follow-up with your primary care provider for further evaluation and management of your symptoms as well as ongoing wellness visits.  I hope you  feel better!    ED Prescriptions     Medication Sig Dispense Auth. Provider   amoxicillin (AMOXIL) 500 MG capsule Take 1 capsule (500 mg total) by mouth 2 (two) times daily for 10 days. 20 capsule Carlisle Beers, FNP      PDMP not reviewed this encounter.   Carlisle Beers, [place] 01/28/23 1130

## 2023-06-25 DIAGNOSIS — R635 Abnormal weight gain: Secondary | ICD-10-CM | POA: Diagnosis not present

## 2023-06-25 DIAGNOSIS — Z13220 Encounter for screening for lipoid disorders: Secondary | ICD-10-CM | POA: Diagnosis not present

## 2023-06-25 DIAGNOSIS — Z0000 Encounter for general adult medical examination without abnormal findings: Secondary | ICD-10-CM | POA: Diagnosis not present

## 2023-09-11 ENCOUNTER — Telehealth: Payer: 59 | Admitting: Physician Assistant

## 2023-09-11 DIAGNOSIS — J069 Acute upper respiratory infection, unspecified: Secondary | ICD-10-CM | POA: Diagnosis not present

## 2023-09-11 MED ORDER — BENZONATATE 100 MG PO CAPS: 100.0000 mg | ORAL_CAPSULE | Freq: Three times a day (TID) | ORAL | 0 refills | Status: DC | PRN

## 2023-09-11 MED ORDER — IPRATROPIUM BROMIDE 0.03 % NA SOLN: 2.0000 | Freq: Two times a day (BID) | NASAL | 0 refills | Status: DC

## 2023-09-11 NOTE — Progress Notes (Signed)
I have spent 5 minutes in review of e-visit questionnaire, review and updating patient chart, medical decision making and response to patient.   Mia Milan Cody Jacklynn Dehaas, PA-C    

## 2023-09-11 NOTE — Progress Notes (Signed)
E-Visit for Upper Respiratory Infection   We are sorry you are not feeling well.  Here is how we plan to help!  Based on what you have shared with me, it looks like you may have a viral upper respiratory infection.  Upper respiratory infections are caused by a large number of viruses; however, rhinovirus is the most common cause. This could be flu giving your exposures but unfortunately you are not outside the window for antiviral medications. As such we need to treat symptoms until you get to feeling better.   Symptoms vary from person to person, with common symptoms including sore throat, cough, fatigue or lack of energy and feeling of general discomfort.  A low-grade fever of up to 100.4 may present, but is often uncommon.  Symptoms vary however, and are closely related to a person's age or underlying illnesses.  The most common symptoms associated with an upper respiratory infection are nasal discharge or congestion, cough, sneezing, headache and pressure in the ears and face.  These symptoms usually persist for about 3 to 10 days, but can last up to 2 weeks.  It is important to know that upper respiratory infections do not cause serious illness or complications in most cases.    Upper respiratory infections can be transmitted from person to person, with the most common method of transmission being a person's hands.  The virus is able to live on the skin and can infect other persons for up to 2 hours after direct contact.  Also, these can be transmitted when someone coughs or sneezes; thus, it is important to cover the mouth to reduce this risk.  To keep the spread of the illness at bay, good hand hygiene is very important.  This is an infection that is most likely caused by a virus. There are no specific treatments other than to help you with the symptoms until the infection runs its course.  We are sorry you are not feeling well.  Here is how we plan to help!   For nasal congestion, you may use an  oral decongestants such as Mucinex D or if you have glaucoma or high blood pressure use plain Mucinex.  Saline nasal spray or nasal drops can help and can safely be used as often as needed for congestion.  For your congestion, I have prescribed Ipratropium Bromide nasal spray 0.03% two sprays in each nostril 2-3 times a day  If you do not have a history of heart disease, hypertension, diabetes or thyroid disease, prostate/bladder issues or glaucoma, you may also use Sudafed to treat nasal congestion.  It is highly recommended that you consult with a pharmacist or your primary care physician to ensure this medication is safe for you to take.     If you have a cough, you may use cough suppressants such as Delsym and Robitussin.  If you have glaucoma or high blood pressure, you can also use Coricidin HBP.   For cough I have prescribed for you A prescription cough medication called Tessalon Perles 100 mg. You may take 1-2 capsules every 8 hours as needed for cough  If you have a sore or scratchy throat, use a saltwater gargle-  to  teaspoon of salt dissolved in a 4-ounce to 8-ounce glass of warm water.  Gargle the solution for approximately 15-30 seconds and then spit.  It is important not to swallow the solution.  You can also use throat lozenges/cough drops and Chloraseptic spray to help with throat pain or  discomfort.  Warm or cold liquids can also be helpful in relieving throat pain.  For headache, pain or general discomfort, you can use Ibuprofen or Tylenol as directed.   Some authorities believe that zinc sprays or the use of Echinacea may shorten the course of your symptoms.   HOME CARE Only take medications as instructed by your medical team. Be sure to drink plenty of fluids. Water is fine as well as fruit juices, sodas and electrolyte beverages. You may want to stay away from caffeine or alcohol. If you are nauseated, try taking small sips of liquids. How do you know if you are getting enough  fluid? Your urine should be a pale yellow or almost colorless. Get rest. Taking a steamy shower or using a humidifier may help nasal congestion and ease sore throat pain. You can place a towel over your head and breathe in the steam from hot water coming from a faucet. Using a saline nasal spray works much the same way. Cough drops, hard candies and sore throat lozenges may ease your cough. Avoid close contacts especially the very young and the elderly Cover your mouth if you cough or sneeze Always remember to wash your hands.   GET HELP RIGHT AWAY IF: You develop worsening fever. If your symptoms do not improve within 10 days You develop yellow or green discharge from your nose over 3 days. You have coughing fits You develop a severe head ache or visual changes. You develop shortness of breath, difficulty breathing or start having chest pain Your symptoms persist after you have completed your treatment plan  MAKE SURE YOU  Understand these instructions. Will watch your condition. Will get help right away if you are not doing well or get worse.  Thank you for choosing an e-visit.  Your e-visit answers were reviewed by a board certified advanced clinical practitioner to complete your personal care plan. Depending upon the condition, your plan could have included both over the counter or prescription medications.  Please review your pharmacy choice. Make sure the pharmacy is open so you can pick up prescription now. If there is a problem, you may contact your provider through [employer] and have the prescription routed to another pharmacy.  Your safety is important to [place]. If you have drug allergies check your prescription carefully.   For the next 24 hours you can use MyChart to ask questions about today's visit, request a non-urgent call back, or ask for a work or school excuse. You will get an email in the next two days asking about your experience. I hope that your e-visit has  been valuable and will speed your recovery.

## 2024-02-08 ENCOUNTER — Ambulatory Visit (HOSPITAL_COMMUNITY): Payer: Self-pay

## 2024-02-08 ENCOUNTER — Ambulatory Visit (INDEPENDENT_AMBULATORY_CARE_PROVIDER_SITE_OTHER)

## 2024-02-08 ENCOUNTER — Ambulatory Visit (HOSPITAL_COMMUNITY)
Admission: EM | Admit: 2024-02-08 | Discharge: 2024-02-08 | Disposition: A | Discharge status: Home / Self Care | Attending: Emergency Medicine | Admitting: Emergency Medicine

## 2024-02-08 ENCOUNTER — Encounter (HOSPITAL_COMMUNITY): Payer: Self-pay | Admitting: *Deleted

## 2024-02-08 DIAGNOSIS — J329 Chronic sinusitis, unspecified: Secondary | ICD-10-CM | POA: Diagnosis not present

## 2024-02-08 DIAGNOSIS — R051 Acute cough: Secondary | ICD-10-CM

## 2024-02-08 DIAGNOSIS — R059 Cough, unspecified: Secondary | ICD-10-CM | POA: Diagnosis not present

## 2024-02-08 DIAGNOSIS — R079 Chest pain, unspecified: Secondary | ICD-10-CM | POA: Diagnosis not present

## 2024-02-08 DIAGNOSIS — B9689 Other specified bacterial agents as the cause of diseases classified elsewhere: Secondary | ICD-10-CM | POA: Diagnosis not present

## 2024-02-08 MED ORDER — AMOXICILLIN-POT CLAVULANATE 875-125 MG PO TABS: 1.0000 | ORAL_TABLET | Freq: Two times a day (BID) | ORAL | 0 refills | Status: DC

## 2024-02-08 MED ORDER — BENZONATATE 100 MG PO CAPS: 100.0000 mg | ORAL_CAPSULE | Freq: Three times a day (TID) | ORAL | 0 refills | Status: DC

## 2024-02-08 MED ORDER — PROMETHAZINE-DM 6.25-15 MG/5ML PO SYRP: 5.0000 mL | ORAL_SOLUTION | Freq: Every evening | ORAL | 0 refills | Status: DC | PRN

## 2024-02-08 MED ORDER — AZELASTINE HCL 0.1 % NA SOLN: 2.0000 | Freq: Two times a day (BID) | NASAL | 0 refills | Status: DC

## 2024-02-08 NOTE — ED Triage Notes (Signed)
 Pt states it started last week with malaise, cough productive, sore throat 1/10 , no fevers as she knows, just not feeling the best. Taking over the counter Advil  cold and sinus and Sudafed without relief

## 2024-02-08 NOTE — ED Provider Notes (Signed)
 [facility]    CSN: [id number] Arrival date & time: 02/08/24  1148      History   Chief Complaint Chief Complaint  Patient presents with   Cough    HPI Margaret Goodman is a 28 y.o. female.   Patient presents with productive cough, sore throat, congestion, sinus pressure/pain, fatigue, body aches, chills, and intermittent chest tightness and chest pain with coughing that began on 6/12.  Denies shortness of breath, known fever, nausea, vomiting, diarrhea, and abdominal pain.  Patient reports she has been taking Advil  cold and sinus and Sudafed with minimal relief.  Patient states that her husband began to have symptoms similar to hers around the same time.  The history is provided by the patient and medical records.  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 Authorizing Provider  amoxicillin -clavulanate (AUGMENTIN) 875-125 MG tablet Take 1 tablet by mouth every 12 (twelve)  hours. 02/08/24  Yes Rosevelt Constable, Theo Krumholz A, NP  azelastine (ASTELIN) 0.1 % nasal spray Place 2 sprays into both nostrils 2 (two) times daily. Use in each nostril as directed 02/08/24  Yes Levora Reas A, NP  benzonatate  (TESSALON ) 100 MG capsule Take 1 capsule (100 mg total) by mouth every 8 (eight) hours. 02/08/24  Yes Levora Reas A, NP  promethazine -dextromethorphan (PROMETHAZINE -DM) 6.25-15 MG/5ML syrup Take 5 mLs by mouth at bedtime as needed for cough. 02/08/24  Yes Rosevelt Constable, Ilisa Hayworth A, NP  ibuprofen  (ADVIL ) 600 MG tablet Take 1 tablet (600 mg total) by mouth every 6 (six) hours. 05/21/22   Leftwich-Kirby, Darren Em, CNM  pantoprazole  (PROTONIX ) 40 MG tablet TAKE 1 TABLET(40 MG) BY MOUTH DAILY 05/08/22   Abigail Abler, MD    Family History Family History  Problem Relation Age of Onset   Healthy Mother    Healthy Father    Thyroid  disease Neg Hx     Social History Social History   Tobacco Use   Smoking status: Never   Smokeless tobacco: Never  Vaping Use   Vaping status: Never Used  Substance Use Topics   Alcohol use: Yes    Comment: ocassionally   Drug use: No  Allergies   Tamiflu [oseltamivir phosphate]   Review of Systems Review of Systems  Respiratory:  Positive for cough.    Per HPI  Physical Exam Triage Vital Signs ED Triage Vitals  Encounter Vitals Group     BP 02/08/24 1207 105/74     Girls Systolic BP Percentile --      Girls Diastolic BP Percentile --      Boys Systolic BP Percentile --      Boys Diastolic BP Percentile --      Pulse Rate 02/08/24 1207 90     Resp 02/08/24 1207 16     Temp 02/08/24 1207 98.3 F (36.8 C)     Temp Source 02/08/24 1207 Oral     SpO2 02/08/24 1207 98 %     Weight --      Height --      Head Circumference --      Peak Flow --      Pain Score 02/08/24 1208 1     Pain Loc --      Pain Education --      Exclude from Growth Chart --    No data found.  Updated Vital Signs BP 105/74 (BP Location: Left Arm)    Pulse 90   Temp 98.3 F (36.8 C) (Oral)   Resp 16   LMP 01/31/2024   SpO2 98%   Visual Acuity Right Eye Distance:   Left Eye Distance:   Bilateral Distance:    Right Eye Near:   Left Eye Near:    Bilateral Near:     Physical Exam Vitals and nursing note reviewed.  Constitutional:      General: She is awake. She is not in acute distress.    Appearance: Normal appearance. She is well-developed and well-groomed. She is not ill-appearing.  HENT:     Right Ear: Tympanic membrane, ear canal and external ear normal.     Left Ear: Tympanic membrane, ear canal and external ear normal.     Nose: Congestion and rhinorrhea present.     Right Sinus: Maxillary sinus tenderness and frontal sinus tenderness present.     Left Sinus: Maxillary sinus tenderness and frontal sinus tenderness present.     Mouth/Throat:     Mouth: Mucous membranes are moist.     Pharynx: Posterior oropharyngeal erythema and postnasal drip present. No oropharyngeal exudate.   Cardiovascular:     Rate and Rhythm: Normal rate and regular rhythm.  Pulmonary:     Effort: Pulmonary effort is normal.     Breath sounds: Normal breath sounds.   Skin:    General: Skin is warm and dry.   Neurological:     Mental Status: She is alert.   Psychiatric:        Behavior: Behavior is cooperative.      UC Treatments / Results  Labs (all labs ordered are listed, but only abnormal results are displayed) Labs Reviewed - No data to display  EKG   Radiology No results found.  Procedures Procedures (including critical care time)  Medications Ordered in UC Medications - No data to display  Initial Impression / Assessment and Plan / UC Course  I have reviewed the triage vital signs and the nursing notes.  Pertinent labs & imaging results that were available during my care of the patient were reviewed by me and considered in my medical decision making (see chart for details).     Patient is overall  well-appearing.  Vitals are  stable.  Upon assessment congestion and rhinorrhea are present, mild erythema and PND noted to pharynx.  Bilateral frontal and maxillary sinus tenderness noted.  Lungs clear bilaterally on auscultation.  Ordered chest x-ray to ensure there is no underlying pneumonia.  Based on my interpretation there is no active cardiopulmonary disease noted on x-ray.  Radiology report confirms this.  Prescribed Augmentin for bacterial sinusitis.  Prescribed Tessalon  and Promethazine  DM as needed for cough.  Prescribed azelastine nasal spray for congestion.  Discussed over-the-counter medication as needed for symptoms.  Discussed return precautions. Final Clinical Impressions(s) / UC Diagnoses   Final diagnoses:  Acute cough  Bacterial sinusitis     Discharge Instructions      As discussed based on my interpretation your x-ray did not reveal any obvious pneumonia.  If radiology report suggests differently I will call and adjust your treatment if needed. Start taking Augmentin twice daily for 7 days for sinus infection Take Tessalon  every 8 hours as needed for cough. Take Promethazine  DM cough syrup at bedtime as needed for cough.  This can make you drowsy so do not drive, work, or drink alcohol while taking this. Use azelastine nasal spray twice daily for additional congestion relief. Otherwise you can take over-the-counter Mucinex or Sudafed to help with your symptoms.   Alternate between 650 mg of Tylenol  and 400 mg of ibuprofen  every 6-8 hours as needed for any pain or fever. Make sure you are staying hydrated and getting plenty of rest. Return here if your symptoms persist or worsen.     ED Prescriptions     Medication Sig Dispense Auth. Provider   amoxicillin -clavulanate (AUGMENTIN) 875-125 MG tablet Take 1 tablet by mouth every 12 (twelve) hours. 14 tablet Rosevelt Constable, Orra Nolde A, NP   benzonatate  (TESSALON ) 100 MG capsule Take 1 capsule (100 mg total) by mouth every 8  (eight) hours. 21 capsule Levora Reas A, NP   promethazine -dextromethorphan (PROMETHAZINE -DM) 6.25-15 MG/5ML syrup Take 5 mLs by mouth at bedtime as needed for cough. 118 mL Rosevelt Constable, Jalin Alicea A, NP   azelastine (ASTELIN) 0.1 % nasal spray Place 2 sprays into both nostrils 2 (two) times daily. Use in each nostril as directed 30 mL Levora Reas A, NP      PDMP not reviewed this encounter.   Levora Reas A, NP 02/08/24 [phone]

## 2024-02-08 NOTE — Discharge Instructions (Signed)
 As discussed based on my interpretation your x-ray did not reveal any obvious pneumonia.  If radiology report suggests differently I will call and adjust your treatment if needed. Start taking Augmentin twice daily for 7 days for sinus infection Take Tessalon  every 8 hours as needed for cough. Take Promethazine  DM cough syrup at bedtime as needed for cough.  This can make you drowsy so do not drive, work, or drink alcohol while taking this. Use azelastine nasal spray twice daily for additional congestion relief. Otherwise you can take over-the-counter Mucinex or Sudafed to help with your symptoms.   Alternate between 650 mg of Tylenol  and 400 mg of ibuprofen  every 6-8 hours as needed for any pain or fever. Make sure you are staying hydrated and getting plenty of rest. Return here if your symptoms persist or worsen.

## 2024-02-15 ENCOUNTER — Telehealth: Admitting: Physician Assistant

## 2024-02-15 DIAGNOSIS — T3695XA Adverse effect of unspecified systemic antibiotic, initial encounter: Secondary | ICD-10-CM | POA: Diagnosis not present

## 2024-02-15 DIAGNOSIS — B379 Candidiasis, unspecified: Secondary | ICD-10-CM

## 2024-02-15 MED ORDER — FLUCONAZOLE 150 MG PO TABS: 150.0000 mg | ORAL_TABLET | ORAL | 0 refills | Status: DC | PRN

## 2024-02-15 NOTE — Progress Notes (Signed)

## 2024-03-13 ENCOUNTER — Ambulatory Visit: Admitting: Internal Medicine

## 2024-03-13 DIAGNOSIS — R002 Palpitations: Secondary | ICD-10-CM | POA: Diagnosis not present

## 2024-03-13 DIAGNOSIS — E0590 Thyrotoxicosis, unspecified without thyrotoxic crisis or storm: Secondary | ICD-10-CM | POA: Diagnosis not present

## 2024-03-13 DIAGNOSIS — F419 Anxiety disorder, unspecified: Secondary | ICD-10-CM | POA: Diagnosis not present

## 2024-04-24 DIAGNOSIS — F419 Anxiety disorder, unspecified: Secondary | ICD-10-CM | POA: Diagnosis not present

## 2024-06-17 ENCOUNTER — Ambulatory Visit

## 2024-06-17 DIAGNOSIS — Z3201 Encounter for pregnancy test, result positive: Secondary | ICD-10-CM

## 2024-06-17 LAB — POCT PREGNANCY, URINE: Preg Test, Ur: POSITIVE — AB

## 2024-06-18 NOTE — Patient Instructions (Signed)
Safe Medications in Pregnancy  ? ?Acne:  ?Benzoyl Peroxide  ?Salicylic Acid  ? ?Backache/Headache:  ?Tylenol: 2 regular strength every 4 hours OR  ?             2 Extra strength every 6 hours  ? ?Colds/Coughs/Allergies:  ?Benadryl (alcohol free) 25 mg every 6 hours as needed  ?Breath right strips  ?Claritin  ?Cepacol throat lozenges  ?Chloraseptic throat spray  ?Cold-Eeze- up to three times per day  ?Cough drops, alcohol free  ?Flonase (by prescription only)  ?Guaifenesin  ?Mucinex  ?Robitussin DM (plain only, alcohol free)  ?Saline nasal spray/drops  ?Sudafed (pseudoephedrine) & Actifed * use only after [redacted] weeks gestation and if you do not have high blood pressure  ?Tylenol  ?Vicks Vaporub  ?Zinc lozenges  ?Zyrtec  ? ?Constipation:  ?Colace  ?Ducolax suppositories  ?Fleet enema  ?Glycerin suppositories  ?Metamucil  ?Milk of magnesia  ?Miralax  ?Senokot  ?Smooth move tea  ? ?Diarrhea:  ?Kaopectate  ?Imodium A-D  ? ?*NO pepto Bismol  ? ?Hemorrhoids:  ?Anusol  ?Anusol HC  ?Preparation H  ?Tucks  ? ?Indigestion:  ?Tums  ?Maalox  ?Mylanta  ?Zantac  ?Pepcid  ? ?Insomnia:  ?Benadryl (alcohol free) 25mg every 6 hours as needed  ?Tylenol PM  ?Unisom, no Gelcaps  ? ?Leg Cramps:  ?Tums  ?MagGel  ? ?Nausea/Vomiting:  ?Bonine  ?Dramamine  ?Emetrol  ?Ginger extract  ?Sea bands  ?Meclizine  ?Nausea medication to take during pregnancy:  ?Unisom (doxylamine succinate 25 mg tablets) Take one tablet daily at bedtime. If symptoms are not adequately controlled, the dose can be increased to a maximum recommended dose of two tablets daily (1/2 tablet in the morning, 1/2 tablet mid-afternoon and one at bedtime).  ?Vitamin B6 100mg tablets. Take one tablet twice a day (up to 200 mg per day).  ? ?Skin Rashes:  ?Aveeno products  ?Benadryl cream or 25mg every 6 hours as needed  ?Calamine Lotion  ?1% cortisone cream  ? ?Yeast infection:  ?Gyne-lotrimin 7  ?Monistat 7  ? ? ?**If taking multiple medications, please check labels to avoid  duplicating the same active ingredients  ?**take medication as directed on the label  ?** Do not exceed 4000 mg of tylenol in 24 hours  ?**Do not take medications that contain aspirin or ibuprofen  ?    ?   ?  ?[facility] Prenatal Care Providers ?         ?[facility] locations:  ?Hours may vary. Please call for an appointment ? ?[facility] at [facility]             ?[address], [place], [place] [address] ?[phone] ? ?[facility] at [facility]                                                             ?[address], [address], [place], [place], [address] ?[phone] ? ?[facility] at [facility]                                    ?[address], [address], , [place], [address] ?[phone] ? ?[facility] at [place] ?[address], [address], [place], [place], [address] ?[phone] ? ?[facility] at [facility]                                 ?[address], [place], [place], [address] ?[phone] ? ?[facility] at [facility]                                    ?[address], [place], [place], [address] ?[phone] ? ?[facility] at [facility] ?[address], [address], [place], [place], [address]                             ?  ?

## 2024-06-18 NOTE — Progress Notes (Signed)
 Margaret Goodman was here in the office 06/17/24 for pregnancy confirmation. The UPT result was positive. I called Margaret Goodman to inform her of the positive test result.   I reviewed her allergies, medications, OB history and medical history.   LMP 04/29/24 EDD 02/03/25  I reviewed MAU precautions and patient verbalized understanding. Advised patient to begin prenatal care and patient states she will call to schedule care with a provide of her choice. I sent the patient a list of safe medications in pregnancy. Patient states she has no further questions or concerns.   Devon, RN 06/18/24

## 2024-06-25 DIAGNOSIS — Z3201 Encounter for pregnancy test, result positive: Secondary | ICD-10-CM | POA: Diagnosis not present

## 2024-06-25 DIAGNOSIS — E7800 Pure hypercholesterolemia, unspecified: Secondary | ICD-10-CM | POA: Diagnosis not present

## 2024-06-25 DIAGNOSIS — N926 Irregular menstruation, unspecified: Secondary | ICD-10-CM | POA: Diagnosis not present

## 2024-06-25 DIAGNOSIS — E0590 Thyrotoxicosis, unspecified without thyrotoxic crisis or storm: Secondary | ICD-10-CM | POA: Diagnosis not present

## 2024-06-25 DIAGNOSIS — O219 Vomiting of pregnancy, unspecified: Secondary | ICD-10-CM | POA: Diagnosis not present

## 2024-06-25 DIAGNOSIS — F419 Anxiety disorder, unspecified: Secondary | ICD-10-CM | POA: Diagnosis not present

## 2024-06-25 DIAGNOSIS — Z0000 Encounter for general adult medical examination without abnormal findings: Secondary | ICD-10-CM | POA: Diagnosis not present

## 2024-07-01 ENCOUNTER — Encounter (HOSPITAL_COMMUNITY): Payer: Self-pay | Admitting: *Deleted

## 2024-07-01 ENCOUNTER — Inpatient Hospital Stay (HOSPITAL_COMMUNITY)
Admission: AD | Admit: 2024-07-01 | Discharge: 2024-07-01 | Disposition: A | Discharge status: Home / Self Care | Attending: Obstetrics and Gynecology | Admitting: Obstetrics and Gynecology

## 2024-07-01 DIAGNOSIS — O99281 Endocrine, nutritional and metabolic diseases complicating pregnancy, first trimester: Secondary | ICD-10-CM | POA: Diagnosis not present

## 2024-07-01 DIAGNOSIS — Z3A09 9 weeks gestation of pregnancy: Secondary | ICD-10-CM | POA: Diagnosis not present

## 2024-07-01 DIAGNOSIS — Z3481 Encounter for supervision of other normal pregnancy, first trimester: Secondary | ICD-10-CM | POA: Diagnosis not present

## 2024-07-01 DIAGNOSIS — E7800 Pure hypercholesterolemia, unspecified: Secondary | ICD-10-CM | POA: Diagnosis not present

## 2024-07-01 DIAGNOSIS — E0590 Thyrotoxicosis, unspecified without thyrotoxic crisis or storm: Secondary | ICD-10-CM | POA: Diagnosis not present

## 2024-07-01 DIAGNOSIS — E860 Dehydration: Secondary | ICD-10-CM | POA: Diagnosis not present

## 2024-07-01 DIAGNOSIS — O219 Vomiting of pregnancy, unspecified: Secondary | ICD-10-CM

## 2024-07-01 DIAGNOSIS — F419 Anxiety disorder, unspecified: Secondary | ICD-10-CM | POA: Diagnosis not present

## 2024-07-01 LAB — URINALYSIS, ROUTINE W REFLEX MICROSCOPIC
Bilirubin Urine: NEGATIVE
Glucose, UA: NEGATIVE mg/dL
Hgb urine dipstick: NEGATIVE
Ketones, ur: 20 mg/dL — AB
Nitrite: NEGATIVE
Protein, ur: 30 mg/dL — AB
Specific Gravity, Urine: 1.034 — ABNORMAL HIGH (ref 1.005–1.030)
pH: 5 (ref 5.0–8.0)

## 2024-07-01 LAB — COMPREHENSIVE METABOLIC PANEL WITH GFR
ALT: 17 U/L (ref 0–44)
AST: 17 U/L (ref 15–41)
Albumin: 3.4 g/dL — ABNORMAL LOW (ref 3.5–5.0)
Alkaline Phosphatase: 39 U/L (ref 38–126)
Anion gap: 10 (ref 5–15)
BUN: 9 mg/dL (ref 6–20)
CO2: 22 mmol/L (ref 22–32)
Calcium: 8.9 mg/dL (ref 8.9–10.3)
Chloride: 102 mmol/L (ref 98–111)
Creatinine, Ser: 0.88 mg/dL (ref 0.44–1.00)
GFR, Estimated: >60 mL/min (ref >60)
Glucose, Bld: 84 mg/dL (ref 70–99)
Potassium: 3.4 mmol/L — ABNORMAL LOW (ref 3.5–5.1)
Sodium: 134 mmol/L — ABNORMAL LOW (ref 135–145)
Total Bilirubin: 0.2 mg/dL (ref 0.0–1.2)
Total Protein: 6.8 g/dL (ref 6.5–8.1)

## 2024-07-01 LAB — CBC
HCT: 36.2 % (ref 36.0–46.0)
Hemoglobin: 12.4 g/dL (ref 12.0–15.0)
MCH: 31.2 pg (ref 26.0–34.0)
MCHC: 34.3 g/dL (ref 30.0–36.0)
MCV: 91 fL (ref 80.0–100.0)
Platelets: 245 K/uL (ref 150–400)
RBC: 3.98 MIL/uL (ref 3.87–5.11)
RDW: 12 % (ref 11.5–15.5)
WBC: 7 K/uL (ref 4.0–10.5)
nRBC: 0 % (ref 0.0–0.2)

## 2024-07-01 MED ORDER — LACTATED RINGERS IV BOLUS
1000.0000 mL | Freq: Once | INTRAVENOUS | Status: AC
  Administered 2024-07-01: 1000 mL via INTRAVENOUS

## 2024-07-01 MED ORDER — SCOPOLAMINE 1 MG/3DAYS TD PT72: 1.0000 | MEDICATED_PATCH | TRANSDERMAL | 1 refills | Status: DC

## 2024-07-01 NOTE — MAU Note (Signed)
 Margaret Goodman is a 28 y.o. at [redacted]w[redacted]d here in MAU reporting: feeling really weak, thinks she is dehydrated. Having a lot of nausea, threw up once this morning.  Has been having hot flashes.  Feeling light headed. No bleeding. Little cramping in lower stomach.  Pt taking meds for nausea, has on scope patch. Onset of complaint: couple wks Pain score: 5 Vitals:   07/01/24 1510  Resp: 16  Temp: 98.5 F (36.9 C)  SpO2: 100%      Lab orders placed from triage:  urine

## 2024-07-01 NOTE — MAU Provider Note (Incomplete)
 History     CSN: [id number]  Arrival date and time: 07/01/24 1449   None     Chief Complaint  Patient presents with   Abdominal Pain   Nausea   light headed   Fatigue   Margaret Goodman , a  28 y.o. [phone] at [redacted]w[redacted]d presents to MAU reporting nausea, an episode of vomiting this AM, and lightheadedness for the past week. She also reports lower abdominal cramping that comes and goes rating it a 4 out of 10. She has not taken anything for the cramping. She has taken Zofran  and is currently wearing a scope patch which has helped with her nausea. She reports not being able to maintain fluid intake given nausea. She reports lightheadedness that is worse with movement and resolves when lying down. She denies vaginal bleeding or abnormal vaginal discharge, odor, or irritation.        Abdominal Pain Associated symptoms include nausea.   OB History     Gravida  5   Para  2   Term  2   Preterm      AB  2   Living  2      SAB  0   IAB  2   Ectopic      Multiple  0   Live Births  2          Past Medical History:  Diagnosis Date   Depression    dr had conversation today about therapy before medicine   Hyperlipidemia    Hyperthyroidism    Hypotension    UTI (urinary tract infection)    Vaginal Pap smear, abnormal    ok since   Past Surgical History:  Procedure Laterality Date   DILATION AND CURETTAGE OF UTERUS     Family History  Problem Relation Age of Onset   Healthy Mother    Healthy Father    Thyroid  disease Neg Hx    Social History   Tobacco Use   Smoking status: Never   Smokeless tobacco: Never  Vaping Use   Vaping status: Never Used  Substance Use Topics   Alcohol use: Not Currently    Comment: ocassionally   Drug use: No   Allergies:  Allergies  Allergen Reactions   Tamiflu [Oseltamivir Phosphate] Hives   Medications Prior to Admission  Medication Sig Dispense Refill Last Dose/Taking   ondansetron  (ZOFRAN ) 4 MG tablet Take 4 mg by  mouth every 8 (eight) hours as needed for nausea or vomiting.   07/01/2024 Morning   pantoprazole  (PROTONIX ) 40 MG tablet TAKE 1 TABLET(40 MG) BY MOUTH DAILY 90 tablet 2 06/30/2024   promethazine  (PHENERGAN ) 12.5 MG suppository Place 12.5 mg rectally every 6 (six) hours as needed for nausea or vomiting.   06/29/2024   scopolamine  (TRANSDERM-SCOP) 1 MG/3DAYS Place 1 patch onto the skin every 3 (three) days.   06/30/2024   sertraline (ZOLOFT) 25 MG tablet Take by mouth. (Patient taking differently: Take 50 mg by mouth daily.)   06/30/2024 Morning   amoxicillin -clavulanate (AUGMENTIN ) 875-125 MG tablet Take 1 tablet by mouth every 12 (twelve) hours. (Patient not taking: No sig reported) 14 tablet 0 Not Taking   azelastine  (ASTELIN ) 0.1 % nasal spray Place 2 sprays into both nostrils 2 (two) times daily. Use in each nostril as directed (Patient not taking: No sig reported) 30 mL 0 Not Taking   benzonatate  (TESSALON ) 100 MG capsule Take 1 capsule (100 mg total) by mouth every 8 (eight) hours. (Patient not taking:  No sig reported) 21 capsule 0 Not Taking   fluconazole  (DIFLUCAN ) 150 MG tablet Take 1 tablet (150 mg total) by mouth every 3 (three) days as needed. (Patient not taking: No sig reported) 2 tablet 0 Not Taking   ibuprofen  (ADVIL ) 600 MG tablet Take 1 tablet (600 mg total) by mouth every 6 (six) hours. (Patient not taking: No sig reported) 30 tablet 0 Not Taking   promethazine -dextromethorphan (PROMETHAZINE -DM) 6.25-15 MG/5ML syrup Take 5 mLs by mouth at bedtime as needed for cough. (Patient not taking: No sig reported) 118 mL 0 Not Taking   Review of Systems  Constitutional: Negative.   Gastrointestinal:  Positive for abdominal pain and nausea.  Neurological:  Positive for light-headedness.   Physical Exam   Temperature 98.5 F (36.9 C), temperature source Oral, resp. rate 16, height 5' 7 (1.702 m), weight 97 kg, last menstrual period 04/29/2024, SpO2 100%, currently  breastfeeding. SABRA Physical Exam Vitals and nursing note reviewed.  Constitutional:      Appearance: She is well-developed.  Cardiovascular:     Rate and Rhythm: Normal rate and regular rhythm.  Pulmonary:     Effort: Pulmonary effort is normal.     Breath sounds: Normal breath sounds.  Abdominal:     General: Bowel sounds are normal.     Tenderness: There is abdominal tenderness in the left lower quadrant.  Skin:    General: Skin is warm and dry.  Neurological:     Mental Status: She is alert and oriented to person, place, and time.  Psychiatric:        Mood and Affect: Mood normal.        Behavior: Behavior normal.    MAU Course  MDM  - UA collected to assess urine concentration for signs of dehydration. - CBC and CMP collected to assess for electrolyte imbalances related to lack of hydration. - IV started and LR bolus given to help replace and restore fluid volume. - After receiving and tolerating cranberry juice and ginger ale, patient no longer reporting nausea. - CBC normal. K+ slightly decreased (3.4) but likely due to lack of hydration. - Patient no longer complaining of lightheadedness or abdominal cramping after interventions. - Planning for discharge after completion of 1L LR bolus. Lab Orders         Urinalysis, Routine w reflex microscopic -Urine, Clean Catch         CBC         Comprehensive metabolic panel     Meds ordered this encounter  Medications   lactated ringers  bolus 1,000 mL   scopolamine  (TRANSDERM-SCOP) 1 MG/3DAYS    Sig: Place 1 patch (1 mg total) onto the skin every 3 (three) days.    Dispense:  24 patch    Refill:  1    Assessment and Plan  1. Dehydration during pregnancy (Primary) - Encouraged to maintain adequate fluid intake to help maintain hydration status to avoid dehydration and to meet the needs of the growing fetus.  2. Nausea and vomiting in pregnancy - Discussed with patient that nausea should typically resolve in the next couple  of weeks, but can persist a little longer. - Patient requesting refill of scopolamine  patch. Send to patient pharmacy  3. [redacted] weeks gestation of pregnancy - Patient reached out to [facility] for initial OB visit appointment. Per [facility], will be notifiy in the next couple of weeks for next opening.  - Patient discharged in stable condition and to report to MAU as needed.  Tommy Daring, WHNP-S 07/01/2024, 7:32 PM

## 2024-07-03 ENCOUNTER — Encounter: Payer: Self-pay | Admitting: Certified Nurse Midwife

## 2024-07-03 MED ORDER — SCOPOLAMINE 1 MG/3DAYS TD PT72: 1.0000 | MEDICATED_PATCH | TRANSDERMAL | 1 refills | Status: DC

## 2024-07-04 ENCOUNTER — Other Ambulatory Visit: Payer: Self-pay

## 2024-07-05 ENCOUNTER — Other Ambulatory Visit (HOSPITAL_BASED_OUTPATIENT_CLINIC_OR_DEPARTMENT_OTHER): Payer: Self-pay

## 2024-07-05 MED ORDER — SERTRALINE HCL 25 MG PO TABS
25.0000 mg | ORAL_TABLET | Freq: Every day | ORAL | 1 refills | Status: DC
  Filled 2024-07-05: qty 60, 30d supply, fill #0

## 2024-07-05 MED ORDER — SERTRALINE HCL 25 MG PO TABS
50.0000 mg | ORAL_TABLET | Freq: Every day | ORAL | 0 refills | Status: AC
  Filled 2024-07-05 – 2024-07-09 (×2): qty 180, 90d supply, fill #0

## 2024-07-06 ENCOUNTER — Other Ambulatory Visit (HOSPITAL_BASED_OUTPATIENT_CLINIC_OR_DEPARTMENT_OTHER): Payer: Self-pay

## 2024-07-09 ENCOUNTER — Other Ambulatory Visit (HOSPITAL_BASED_OUTPATIENT_CLINIC_OR_DEPARTMENT_OTHER): Payer: Self-pay

## 2024-07-09 ENCOUNTER — Telehealth

## 2024-07-09 ENCOUNTER — Other Ambulatory Visit (HOSPITAL_COMMUNITY): Payer: Self-pay

## 2024-07-09 DIAGNOSIS — O0991 Supervision of high risk pregnancy, unspecified, first trimester: Secondary | ICD-10-CM

## 2024-07-09 DIAGNOSIS — O0990 Supervision of high risk pregnancy, unspecified, unspecified trimester: Secondary | ICD-10-CM | POA: Diagnosis not present

## 2024-07-09 DIAGNOSIS — O99280 Endocrine, nutritional and metabolic diseases complicating pregnancy, unspecified trimester: Secondary | ICD-10-CM | POA: Diagnosis not present

## 2024-07-09 DIAGNOSIS — Z3A10 10 weeks gestation of pregnancy: Secondary | ICD-10-CM

## 2024-07-09 MED ORDER — METOCLOPRAMIDE HCL 10 MG PO TABS
10.0000 mg | ORAL_TABLET | Freq: Three times a day (TID) | ORAL | 4 refills | Status: AC
  Filled 2024-07-09: qty 90, 30d supply, fill #0

## 2024-07-09 NOTE — Progress Notes (Signed)
 New OB Intake  I connected with Margaret Goodman  on 07/09/24 at  9:15 AM EST by MyChart Video Visit and verified that I am speaking with the correct person using two identifiers. Nurse is located at [facility] and pt is located at car at work.  I discussed the limitations, risks, security and privacy concerns of performing an evaluation and management service by telephone and the availability of in person appointments. I also discussed with the patient that there may be a patient responsible charge related to this service. The patient expressed understanding and agreed to proceed.  I explained I am completing New OB Intake today. We discussed EDD of 02/03/2025 based on LMP of 04/29/2024. Pt is [id number]. I reviewed her allergies, medications and Medical/Surgical/OB history.    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 Discussed the nature of our practice with multiple providers including residents and students as well as female and female providers. Due to the size of the practice, the delivering provider may not be the same as those providing prenatal care.   Patient is not interested in water birth.  MyChart/Babyscripts MyChart access verified. I explained pt will have some visits in office and some virtually. Babyscripts instructions given and order placed. Patient verifies receipt of registration text/e-mail. Account successfully created and app downloaded. If patient is a candidate for Optimized scheduling, add to sticky note.   Blood Pressure Cuff/Weight Scale Patient has private  insurance; instructed to purchase blood pressure cuff and bring to first prenatal appt. Explained after first prenatal appt pt will check weekly and document in Babyscripts. Patient does not have weight scale; patient may purchase if they desire to track weight weekly in Babyscripts.  Anatomy US  Explained first scheduled US  will be around 19 weeks. Anatomy US  scheduled for 09/11/2024 at 0800.  Is patient a CenteringPregnancy candidate?  Declined Declined due to Schedule    Is patient a Mom+Baby Combined Care candidate?  Not a candidate   If accepted, confirm patient does not intend to move from the area for at least 12 months, then notify Mom+Baby staff  Is patient a candidate for Babyscripts Optimization? Yes, patient declined   First visit review I reviewed new OB appt with patient. Explained pt will be seen by Rocky Satterfield, at first visit. Discussed [facility] genetic screening with patient. Would like Panorama. Routine prenatal labs need to be collected to NOB.   Last Pap Diagnosis  Date Value Ref Range Status  06/22/2022   Final   - Negative for intraepithelial lesion or malignancy (NILM)    Cyndee JAYSON Molt, RN 07/09/2024  9:49 AM

## 2024-07-10 ENCOUNTER — Other Ambulatory Visit: Payer: Self-pay

## 2024-07-10 ENCOUNTER — Other Ambulatory Visit (HOSPITAL_BASED_OUTPATIENT_CLINIC_OR_DEPARTMENT_OTHER): Payer: Self-pay

## 2024-07-10 ENCOUNTER — Ambulatory Visit (INDEPENDENT_AMBULATORY_CARE_PROVIDER_SITE_OTHER)

## 2024-07-10 ENCOUNTER — Other Ambulatory Visit: Payer: Self-pay | Admitting: Student

## 2024-07-10 DIAGNOSIS — O0990 Supervision of high risk pregnancy, unspecified, unspecified trimester: Secondary | ICD-10-CM

## 2024-07-10 DIAGNOSIS — O0991 Supervision of high risk pregnancy, unspecified, first trimester: Secondary | ICD-10-CM | POA: Diagnosis not present

## 2024-07-10 DIAGNOSIS — Z3A09 9 weeks gestation of pregnancy: Secondary | ICD-10-CM | POA: Diagnosis not present

## 2024-07-11 ENCOUNTER — Encounter: Payer: Self-pay | Admitting: Internal Medicine

## 2024-07-11 ENCOUNTER — Ambulatory Visit: Admitting: Internal Medicine

## 2024-07-11 VITALS — BP 118/72 | HR 78 | Ht 67.0 in | Wt 213.0 lb

## 2024-07-11 DIAGNOSIS — E0590 Thyrotoxicosis, unspecified without thyrotoxic crisis or storm: Secondary | ICD-10-CM | POA: Insufficient documentation

## 2024-07-11 NOTE — Progress Notes (Signed)
 Name: Margaret Goodman  MRN/ DOB: [id number], [birth year]    Age/ Sex: 28 y.o., female     PCP: Margaret Velna SAUNDERS, MD   Reason for Endocrinology Evaluation: Hyperthyroidism     Initial Endocrinology Clinic Visit: 10/20/2021    PATIENT IDENTIFIER: Margaret Goodman is a 27 y.o., female with a past medical history of hyperthyroidism. She has followed with [facility] since 10/20/2021 for consultative assistance with management of her Hyperthyroidism.     HISTORICAL SUMMARY: The patient was first diagnosed with hyperthyroidism in 02/2021.  She was initially on methimazole  but this was switched to PTU during pregnancy 09/2021 and was switched back to methimazole  during her second trimester in May 2023 during her second pregnancy which ended up with miscarriage. Her TFT's normalized after that until her 3rd pregnancy    No Fh of thyroid  disease   She is s/p delivery of baby boy in 04/2022-[place]   She has been off methimazole  since June, 2023  SUBJECTIVE:    Today (07/11/2024):  Margaret Goodman is here for follow-up follow-up on hyperthyroidism .  She has not been to our clinic in over 2 years.  At the time she was at the third trimester of her pregnancy.  She was lost to follow-up after delivery   She returns again today at 10.3 weeks of gestation. She is G5P2 EDD 02/03/2025 LMP 04/29/2024    No local neck swelling  Has palpitations  No diarrhea or loose stools  Has mild tremors  Mild jiterry sensation No eye symptoms      HISTORY:  Past Medical History:  Past Medical History:  Diagnosis Date   Depression    dr had conversation today about therapy before medicine   Hyperlipidemia    Hyperthyroidism    Hypotension    UTI (urinary tract infection)    Vaginal Pap smear, abnormal    ok since   Past Surgical History:  Past Surgical History:  Procedure Laterality Date   DILATION AND CURETTAGE OF UTERUS  2022   Social History:  reports that she has never smoked.  She has never used smokeless tobacco. She reports that she does not currently use alcohol. She reports that she does not use drugs. Family History:  Family History  Problem Relation Age of Onset   Healthy Mother    Healthy Father    Thyroid  disease Neg Hx      HOME MEDICATIONS: Allergies as of 07/11/2024       Reactions   Tamiflu [oseltamivir Phosphate] Hives        Medication List        Accurate as of July 11, 2024 11:40 AM. If you have any questions, ask your nurse or doctor.          hydrOXYzine 25 MG tablet Commonly known as: ATARAX Take 25 mg by mouth daily as needed.   metoCLOPramide  10 MG tablet Commonly known as: REGLAN  Take 1 tablet (10 mg total) by mouth every 8 (eight) hours.   ondansetron  4 MG tablet Commonly known as: ZOFRAN  Take 4 mg by mouth every 8 (eight) hours as needed for nausea or vomiting.   pantoprazole  40 MG tablet Commonly known as: PROTONIX  TAKE 1 TABLET(40 MG) BY MOUTH DAILY   promethazine  12.5 MG suppository Commonly known as: PHENERGAN  Place 12.5 mg rectally every 6 (six) hours as needed for nausea or vomiting.   scopolamine  1 MG/3DAYS Commonly known as: TRANSDERM-SCOP Place 1 patch onto the skin every 3 (three) days.  What changed: Another medication with the same name was removed. Continue taking this medication, and follow the directions you see here. Changed by: Margaret Goodman   sertraline  25 MG tablet Commonly known as: ZOLOFT  Take 2 tablets (50 mg total) by mouth daily. What changed: Another medication with the same name was removed. Continue taking this medication, and follow the directions you see here. Changed by: Margaret Goodman          OBJECTIVE:   PHYSICAL EXAM: VS:BP 118/72   Pulse 78   Ht 5' 7 (1.702 m)   Wt 213 lb (96.6 kg)   LMP 04/29/2024 (Exact Date)   SpO2 98%   BMI 33.36 kg/m    EXAM: General: Pt appears well and is in NAD  Eyes: External eye exam normal without stare,  lid lag or exophthalmos.   Neck: General: Supple without adenopathy. Thyroid : Thyroid  size normal.  No goiter or nodules appreciated.   Lungs: Clear with good BS bilat   Heart: Auscultation: RRR.  Extremities:  BL LE: No pretibial edema   Mental Status: Judgment, insight: Intact Orientation: Oriented to time, place, and person Mood and affect: No depression, anxiety, or agitation     DATA REVIEWED:  07/01/2024 FT4 0.75 (0.61-1.12) TSH 0.14 ( 0.34-4.5)    ASSESSMENT / PLAN / RECOMMENDATIONS:   1.Hyperthyroidism :  - Patient is currently pregnant in the first trimester -TFTs from July, 2025 with within normal range, but he recently TSH was noted to be low at PCPs office -Historically we have had high suspicion for Graves' disease, TRAb has been undetectable in the past  -She has been off thionamide therapy since June 2023 -The goal of treatment is to maintain persistent but mild hyperthyroidism in the mother in an attempt to prevent fetal hypothyroidism since the fetal thyroid  is more sensitive to the action of thionamide therapy. Overtreatment of maternal hyperthyroidism can cause fetal goiter and primary hypothyroidism.     Follow-up in 2 months  Labs in ~ 3 weeks    Signed electronically by: Margaret Redgie Butts, MD  [facility]  [facility] [address] [place], [place] [address] Phone: [phone] FAX: [phone]      CC: Margaret Velna SAUNDERS, MD [address] [place] [address] Phone: [phone]  Fax: [phone]   Return to Endocrinology clinic as below: Future Appointments  Date Time Provider Department Center  07/21/2024  9:55 AM Jerilynn Longs, NP [facility]  09/11/2024  8:00 AM [facility] PROVIDER 1 [facility]  09/11/2024  8:30 AM [facility] US1 [facility]

## 2024-07-14 ENCOUNTER — Encounter: Payer: Self-pay | Admitting: Certified Nurse Midwife

## 2024-07-14 ENCOUNTER — Inpatient Hospital Stay (HOSPITAL_COMMUNITY)
Admission: AD | Admit: 2024-07-14 | Discharge: 2024-07-14 | Disposition: A | Discharge status: Home / Self Care | Payer: Self-pay | Attending: Obstetrics & Gynecology | Admitting: Obstetrics & Gynecology

## 2024-07-14 ENCOUNTER — Encounter (HOSPITAL_COMMUNITY): Payer: Self-pay | Admitting: Obstetrics & Gynecology

## 2024-07-14 DIAGNOSIS — O219 Vomiting of pregnancy, unspecified: Secondary | ICD-10-CM | POA: Diagnosis not present

## 2024-07-14 DIAGNOSIS — Z3A10 10 weeks gestation of pregnancy: Secondary | ICD-10-CM | POA: Diagnosis not present

## 2024-07-14 DIAGNOSIS — Z79899 Other long term (current) drug therapy: Secondary | ICD-10-CM | POA: Diagnosis not present

## 2024-07-14 DIAGNOSIS — O99281 Endocrine, nutritional and metabolic diseases complicating pregnancy, first trimester: Secondary | ICD-10-CM | POA: Diagnosis not present

## 2024-07-14 DIAGNOSIS — O99280 Endocrine, nutritional and metabolic diseases complicating pregnancy, unspecified trimester: Secondary | ICD-10-CM

## 2024-07-14 DIAGNOSIS — R112 Nausea with vomiting, unspecified: Secondary | ICD-10-CM | POA: Diagnosis not present

## 2024-07-14 DIAGNOSIS — E860 Dehydration: Secondary | ICD-10-CM | POA: Insufficient documentation

## 2024-07-14 LAB — COMPREHENSIVE METABOLIC PANEL WITH GFR
ALT: 16 U/L (ref 0–44)
AST: 17 U/L (ref 15–41)
Albumin: 3.5 g/dL (ref 3.5–5.0)
Alkaline Phosphatase: 40 U/L (ref 38–126)
Anion gap: 14 (ref 5–15)
BUN: 9 mg/dL (ref 6–20)
CO2: 18 mmol/L — ABNORMAL LOW (ref 22–32)
Calcium: 9 mg/dL (ref 8.9–10.3)
Chloride: 103 mmol/L (ref 98–111)
Creatinine, Ser: 0.89 mg/dL (ref 0.44–1.00)
GFR, Estimated: >60 mL/min (ref >60)
Glucose, Bld: 43 mg/dL — CL (ref 70–99)
Potassium: 3.5 mmol/L (ref 3.5–5.1)
Sodium: 135 mmol/L (ref 135–145)
Total Bilirubin: 0.7 mg/dL (ref 0.0–1.2)
Total Protein: 7.1 g/dL (ref 6.5–8.1)

## 2024-07-14 LAB — CBC WITH DIFFERENTIAL/PLATELET
Abs Immature Granulocytes: 0.02 K/uL (ref 0.00–0.07)
Basophils Absolute: 0 K/uL (ref 0.0–0.1)
Basophils Relative: 1 %
Eosinophils Absolute: 0 K/uL (ref 0.0–0.5)
Eosinophils Relative: 1 %
HCT: 36.4 % (ref 36.0–46.0)
Hemoglobin: 12.6 g/dL (ref 12.0–15.0)
Immature Granulocytes: 0 %
Lymphocytes Relative: 28 %
Lymphs Abs: 1.7 K/uL (ref 0.7–4.0)
MCH: 31.4 pg (ref 26.0–34.0)
MCHC: 34.6 g/dL (ref 30.0–36.0)
MCV: 90.8 fL (ref 80.0–100.0)
Monocytes Absolute: 0.6 K/uL (ref 0.1–1.0)
Monocytes Relative: 10 %
Neutro Abs: 3.7 K/uL (ref 1.7–7.7)
Neutrophils Relative %: 60 %
Platelets: 259 K/uL (ref 150–400)
RBC: 4.01 MIL/uL (ref 3.87–5.11)
RDW: 12 % (ref 11.5–15.5)
WBC: 6.1 K/uL (ref 4.0–10.5)
nRBC: 0 % (ref 0.0–0.2)

## 2024-07-14 LAB — URINALYSIS, ROUTINE W REFLEX MICROSCOPIC
Bilirubin Urine: NEGATIVE
Glucose, UA: NEGATIVE mg/dL
Hgb urine dipstick: NEGATIVE
Ketones, ur: 80 mg/dL — AB
Nitrite: NEGATIVE
Protein, ur: 100 mg/dL — AB
Specific Gravity, Urine: 1.034 — ABNORMAL HIGH (ref 1.005–1.030)
WBC, UA: >50 WBC/hpf (ref 0–5)
pH: 5 (ref 5.0–8.0)

## 2024-07-14 MED ORDER — ONDANSETRON HCL 4 MG/2ML IJ SOLN
4.0000 mg | Freq: Once | INTRAMUSCULAR | Status: AC
  Administered 2024-07-14: 4 mg via INTRAVENOUS
  Filled 2024-07-14: qty 2

## 2024-07-14 MED ORDER — LACTATED RINGERS IV BOLUS
1000.0000 mL | Freq: Once | INTRAVENOUS | Status: AC
  Administered 2024-07-14: 1000 mL via INTRAVENOUS

## 2024-07-14 MED ORDER — SCOPOLAMINE 1 MG/3DAYS TD PT72
1.0000 | MEDICATED_PATCH | Freq: Once | TRANSDERMAL | Status: DC
  Administered 2024-07-14: 1 mg via TRANSDERMAL
  Filled 2024-07-14: qty 1

## 2024-07-14 MED ORDER — PROMETHAZINE HCL 25 MG PO TABS: 25.0000 mg | ORAL_TABLET | Freq: Four times a day (QID) | ORAL | 0 refills | Status: DC | PRN

## 2024-07-14 MED ORDER — INFUVITE ADULT IV SOLN
Freq: Once | INTRAVENOUS | Status: AC
  Filled 2024-07-14: qty 10

## 2024-07-14 NOTE — MAU Note (Addendum)
..  Margaret Goodman is a 28 y.o. at [redacted]w[redacted]d here in MAU reporting: nausea and vomiting for 2 days. Can not keep anything down and has vomited 5-6 times in the past 24 hours. Took zofran  at 1600, but felt no relief.  Denies pain or vaginal bleeding.  Has not had first prenatal visit, has one scheduled on 07/21/2024.  Pain score: 0/10 Vitals:   07/14/24 0055  BP: 120/66  Pulse: 90  Resp: 16  Temp: 98.5 F (36.9 C)  SpO2: 100%     FHT: 169 Lab orders placed from triage:  UA

## 2024-07-14 NOTE — MAU Provider Note (Signed)
 History     CSN: [id number]  Arrival date and time: 07/14/24 9963   Event Date/Time   First Provider Initiated Contact with Patient 07/14/24 [phone]      Chief Complaint  Patient presents with   Emesis   Nausea   Margaret Goodman , a  28 y.o. [id number] at [redacted]w[redacted]d presents to MAU with complaints of nausea and vomiting. Patient states that she has been throwing up since Friday of last week, and it became progressively worse since Sunday. She reports 7-8 episodes of emesis in the last 24 hours. Last time was 1900.  She reports attempting ODT zofran  at 1600 today with minimal relief. She also reports using a scop patch but reports taking it off today and not replacing it. She endorses a history of HG in a previous pregnancy. She denies vaginal bleeding leaking of fluid and contractions.        Emesis  Pertinent negatives include no abdominal pain, chest pain, chills, diarrhea, fever or headaches.    OB History     Gravida  5   Para  2   Term  2   Preterm      AB  2   Living  2      SAB  0   IAB  2   Ectopic      Multiple  0   Live Births  2           Past Medical History:  Diagnosis Date   Depression    dr had conversation today about therapy before medicine   Hyperlipidemia    Hyperthyroidism    Hypotension    UTI (urinary tract infection)    Vaginal Pap smear, abnormal    ok since    Past Surgical History:  Procedure Laterality Date   DILATION AND CURETTAGE OF UTERUS  2022    Family History  Problem Relation Age of Onset   Healthy Mother    Healthy Father    Thyroid  disease Neg Hx     Social History   Tobacco Use   Smoking status: Never   Smokeless tobacco: Never  Vaping Use   Vaping status: Never Used  Substance Use Topics   Alcohol use: Not Currently    Comment: ocassionally   Drug use: No    Allergies:  Allergies  Allergen Reactions   Tamiflu [Oseltamivir Phosphate] Hives    Medications Prior to Admission  Medication Sig  Dispense Refill Last Dose/Taking   ondansetron  (ZOFRAN ) 4 MG tablet Take 4 mg by mouth every 8 (eight) hours as needed for nausea or vomiting.   07/13/2024 at  4:00 PM   pantoprazole  (PROTONIX ) 40 MG tablet TAKE 1 TABLET(40 MG) BY MOUTH DAILY 90 tablet 2 07/13/2024 at  4:00 PM   hydrOXYzine (ATARAX) 25 MG tablet Take 25 mg by mouth daily as needed.      metoCLOPramide  (REGLAN ) 10 MG tablet Take 1 tablet (10 mg total) by mouth every 8 (eight) hours. 90 tablet 4    promethazine  (PHENERGAN ) 12.5 MG suppository Place 12.5 mg rectally every 6 (six) hours as needed for nausea or vomiting.      scopolamine  (TRANSDERM-SCOP) 1 MG/3DAYS Place 1 patch onto the skin every 3 (three) days.      sertraline  (ZOLOFT ) 25 MG tablet Take 2 tablets (50 mg total) by mouth daily. 180 tablet 0     Review of Systems  Constitutional:  Negative for chills, fatigue and fever.  Eyes:  Negative for pain  and visual disturbance.  Respiratory:  Negative for apnea, shortness of breath and wheezing.   Cardiovascular:  Negative for chest pain and palpitations.  Gastrointestinal:  Positive for nausea and vomiting. Negative for abdominal pain, constipation and diarrhea.  Genitourinary:  Negative for difficulty urinating, dysuria, pelvic pain, vaginal bleeding, vaginal discharge and vaginal pain.  Musculoskeletal:  Negative for back pain.  Neurological:  Negative for seizures, weakness and headaches.  Psychiatric/Behavioral:  Negative for suicidal ideas.    Physical Exam   Blood pressure 120/66, pulse 90, temperature 98.5 F (36.9 C), temperature source Oral, resp. rate 16, height 5' 7 (1.702 m), weight 93.1 kg, last menstrual period 04/29/2024, SpO2 100%.  Physical Exam Vitals and nursing note reviewed.  Constitutional:      General: She is not in acute distress.    Appearance: Normal appearance. She is ill-appearing.  HENT:     Head: Normocephalic.  Pulmonary:     Effort: Pulmonary effort is normal.  Abdominal:      Palpations: Abdomen is soft.     Tenderness: There is no abdominal tenderness.  Musculoskeletal:     Cervical back: Normal range of motion.  Skin:    General: Skin is warm and dry.  Neurological:     Mental Status: She is alert and oriented to person, place, and time.  Psychiatric:        Mood and Affect: Mood normal.    FHT obtained in triage   MAU Course  Procedures Orders Placed This Encounter  Procedures   Urinalysis, Routine w reflex microscopic -Urine, Clean Catch   CBC with Differential/Platelet   Comprehensive metabolic panel   Insert peripheral IV   Discharge patient Discharge disposition: 01-Home or Self Care; Discharge patient date: 07/14/2024   Meds ordered this encounter  Medications   lactated ringers  bolus 1,000 mL   ondansetron  (ZOFRAN ) injection 4 mg   scopolamine  (TRANSDERM-SCOP) 1 MG/3DAYS 1 mg   M.V.I. Adult  (INFUVITE ADULT ) 10 mL in dextrose 5% lactated ringers  1,000 mL infusion   promethazine  (PHENERGAN ) 25 MG tablet    Sig: Take 1 tablet (25 mg total) by mouth every 6 (six) hours as needed for nausea or vomiting.    Dispense:  30 tablet    Refill:  0    Supervising Provider:   PRATT, TANYA S [[id number]]    MDM - UA shows worsened dehydration from 13 days ago,.  - IV fluids and antiemetics ordered.  - Multivitamin bag ordered  - PO Challenge  - PO challenge successful  - patient reports feeling better.  - Labs pending upon discharge  - plan for discharge.   Assessment and Plan   1. Nausea and vomiting, unspecified vomiting type   2. [redacted] weeks gestation of pregnancy   3. Dehydration during pregnancy    - Recommended that patient stick to a round the clock medication regimen for the next few days.  - Discussed OTC options for comfort as well like sea bands and ginger chews.  - Reviewed worsening signs and return precautions.  - Patient discharged home in stable condition and may return to MAU as needed.   Claris CHRISTELLA Cedar, MSN CNM  07/14/2024, 5:38  AM

## 2024-07-15 ENCOUNTER — Other Ambulatory Visit (HOSPITAL_COMMUNITY): Payer: Self-pay

## 2024-07-21 ENCOUNTER — Ambulatory Visit: Payer: Self-pay | Admitting: Student

## 2024-07-21 ENCOUNTER — Encounter: Payer: Self-pay | Admitting: Student

## 2024-07-21 ENCOUNTER — Other Ambulatory Visit (HOSPITAL_COMMUNITY)
Admission: RE | Admit: 2024-07-21 | Discharge: 2024-07-21 | Disposition: A | Source: Ambulatory Visit | Attending: Student | Admitting: Student

## 2024-07-21 ENCOUNTER — Other Ambulatory Visit: Payer: Self-pay

## 2024-07-21 VITALS — BP 116/64 | HR 75 | Wt 208.3 lb

## 2024-07-21 DIAGNOSIS — O0990 Supervision of high risk pregnancy, unspecified, unspecified trimester: Secondary | ICD-10-CM

## 2024-07-21 DIAGNOSIS — O99281 Endocrine, nutritional and metabolic diseases complicating pregnancy, first trimester: Secondary | ICD-10-CM | POA: Diagnosis not present

## 2024-07-21 DIAGNOSIS — E0590 Thyrotoxicosis, unspecified without thyrotoxic crisis or storm: Secondary | ICD-10-CM

## 2024-07-21 DIAGNOSIS — Z3A11 11 weeks gestation of pregnancy: Secondary | ICD-10-CM

## 2024-07-21 DIAGNOSIS — O0991 Supervision of high risk pregnancy, unspecified, first trimester: Secondary | ICD-10-CM

## 2024-07-21 NOTE — Assessment & Plan Note (Addendum)
-  Has f/u with endo next week. Has plan for labs with them. Not on medication at this point. Denies symptoms.

## 2024-07-21 NOTE — Assessment & Plan Note (Addendum)
-  New OB labs collected today -Reports continued nausea. Vomiting improved with multiple modes of treatment. Seen in MAU last week. Refills provided for meds.

## 2024-07-21 NOTE — Progress Notes (Signed)
 INITIAL PRENATAL VISIT  Subjective:   Margaret Goodman is being seen today for her first obstetrical visit.  This is a planned pregnancy. This is a desired pregnancy.  She is at [redacted]w[redacted]d gestation by LMP c/w early ultrasound. Her obstetrical history is significant for hyperthyroidism. Relationship with FOB: significant other, living together. Patient does intend to breast feed. Pregnancy history fully reviewed.  Patient reports nausea and vomiting.  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     metoCLOPramide  (REGLAN ) 10 MG tablet Take 1 tablet (10 mg total) by mouth every 8 (eight) hours. 90 tablet 4   ondansetron  (ZOFRAN ) 4 MG tablet Take 4 mg by mouth every 8 (eight) hours as needed for nausea or vomiting.     pantoprazole  (PROTONIX ) 40 MG tablet TAKE 1 TABLET(40 MG) BY MOUTH DAILY 90 tablet 2   promethazine  (PHENERGAN ) 12.5 MG suppository Place 12.5 mg rectally every 6 (six) hours as needed for nausea or vomiting.     scopolamine   (TRANSDERM-SCOP) 1 MG/3DAYS Place 1 patch onto the skin every 3 (three) days.     sertraline  (ZOLOFT ) 25 MG tablet Take 2 tablets (50 mg total) by mouth daily. 180 tablet 0   promethazine  (PHENERGAN ) 25 MG tablet Take 1 tablet (25 mg total) by mouth every 6 (six) hours as needed for nausea or vomiting. (Patient not taking: Reported on 07/21/2024) 30 tablet 0   No current facility-administered medications on file prior to visit.    Allergies  Allergen Reactions   Tamiflu [Oseltamivir Phosphate] Hives    Social History:  reports that she has never smoked. She has never used smokeless tobacco. She reports that she does not currently use alcohol. She reports that she does not use drugs.  Family History  Problem Relation Age of Onset   Healthy Mother    Healthy Father    Thyroid  disease Neg Hx     The following portions of the patient's history were reviewed and updated as appropriate: allergies, current medications, past family history, past medical history, past social history, past surgical history and problem list.  Review of Systems Review of Systems  All other systems reviewed  and are negative.     Physical Exam:  BP 116/64   Pulse 75   Wt 208 lb 4.8 oz (94.5 kg)   LMP 04/29/2024 (Exact Date)   BMI 32.62 kg/m  CONSTITUTIONAL: Well-developed, well-nourished female in no acute distress.  HENT:  Normocephalic, atraumatic.  Oropharynx is clear and moist EYES: Conjunctivae normal. No scleral icterus.  NECK: Normal range of motion, supple, no masses.  Normal thyroid .  SKIN: Skin is warm and dry. No rash noted. Not diaphoretic. No erythema. No pallor. MUSCULOSKELETAL: Normal range of motion. No tenderness.  No cyanosis, clubbing, or edema.   NEUROLOGIC: Alert and oriented to person, place, and time. Normal muscle tone coordination.  PSYCHIATRIC: Normal mood and affect. Normal behavior. Normal judgment and thought content. CARDIOVASCULAR: Normal heart rate noted, regular  rhythm RESPIRATORY: Clear to auscultation bilaterally. Effort and breath sounds normal, no problems with respiration noted.  Fetal Heart Rate (bpm): 172           Assessment:    Pregnancy: [id number]   Assessment & Plan Supervision of high risk pregnancy, antepartum -New OB labs collected today -Reports continued nausea. Vomiting improved with multiple modes of treatment. Seen in MAU last week. Refills provided for meds.  Hyperthyroidism in pregnancy, antepartum -Has f/u with endo next week. Has plan for labs with them. Not on medication at this point. Denies symptoms.  [redacted] weeks gestation of pregnancy      Plan:     Initial labs drawn. Prenatal vitamins. Problem list reviewed and updated. Reviewed in detail the nature of the practice with collaborative care between  Genetic screening discussed: NIPS/First trimester screen/Quad/AFP requested. Role of ultrasound in pregnancy discussed; Anatomy US : scheduled. Follow up in 4 weeks. Weight gain recommendations per IOM guidelines reviewed: underweight/BMI 18.5 or less > 28 - 40 lbs; normal weight/BMI 18.5 - 24.9 > 25 - 35 lbs; overweight/BMI 25 - 29.9 > 15 - 25 lbs; obese/BMI  30 or more > 11 - 20 lbs.  Discussed clinic routines, schedule of care and testing, genetic screening options, involvement of students and residents under the direct supervision of APPs and doctors and presence of female providers. Pt verbalized understanding.   Jerilynn Longs, NP 07/21/2024 12:20 PM

## 2024-07-22 LAB — CBC/D/PLT+RPR+RH+ABO+RUBIGG...
Antibody Screen: NEGATIVE
Basophils Absolute: 0 x10E3/uL (ref 0.0–0.2)
Basos: 0 %
EOS (ABSOLUTE): 0.1 x10E3/uL (ref 0.0–0.4)
Eos: 1 %
HCV Ab: NONREACTIVE
HIV Screen 4th Generation wRfx: NONREACTIVE
Hematocrit: 38.3 % (ref 34.0–46.6)
Hemoglobin: 12.5 g/dL (ref 11.1–15.9)
Hepatitis B Surface Ag: NEGATIVE
Immature Grans (Abs): 0 x10E3/uL (ref 0.0–0.1)
Immature Granulocytes: 0 %
Lymphocytes Absolute: 1.8 x10E3/uL (ref 0.7–3.1)
Lymphs: 30 %
MCH: 31.3 pg (ref 26.6–33.0)
MCHC: 32.6 g/dL (ref 31.5–35.7)
MCV: 96 fL (ref 79–97)
Monocytes Absolute: 0.5 x10E3/uL (ref 0.1–0.9)
Monocytes: 8 %
Neutrophils Absolute: 3.6 x10E3/uL (ref 1.4–7.0)
Neutrophils: 61 %
Platelets: 224 x10E3/uL (ref 150–450)
RBC: 3.99 x10E6/uL (ref 3.77–5.28)
RDW: 12.9 % (ref 11.7–15.4)
RPR Ser Ql: NONREACTIVE
Rh Factor: POSITIVE
Rubella Antibodies, IGG: 1.48 {index} (ref >0.99)
WBC: 6 x10E3/uL (ref 3.4–10.8)

## 2024-07-22 LAB — HEMOGLOBIN A1C
Est. average glucose Bld gHb Est-mCnc: 97 mg/dL
Hgb A1c MFr Bld: 5 % (ref 4.8–5.6)

## 2024-07-22 LAB — GC/CHLAMYDIA PROBE AMP (~~LOC~~) NOT AT ARMC
Chlamydia: NEGATIVE
Comment: NEGATIVE
Comment: NORMAL
Neisseria Gonorrhea: NEGATIVE

## 2024-07-22 LAB — HCV INTERPRETATION

## 2024-07-28 LAB — PANORAMA PRENATAL TEST FULL PANEL:PANORAMA TEST PLUS 5 ADDITIONAL MICRODELETIONS: FETAL FRACTION: 10.2

## 2024-07-29 ENCOUNTER — Other Ambulatory Visit

## 2024-07-31 ENCOUNTER — Ambulatory Visit: Payer: Self-pay | Admitting: Student

## 2024-08-18 ENCOUNTER — Other Ambulatory Visit

## 2024-08-19 ENCOUNTER — Ambulatory Visit: Payer: Self-pay | Admitting: Internal Medicine

## 2024-08-19 LAB — TSH: TSH: 0.2 m[IU]/L — ABNORMAL LOW

## 2024-08-19 LAB — T3: T3, Total: 143 ng/dL (ref 76–181)

## 2024-08-19 LAB — T4: T4, Total: 10.8 ug/dL (ref 5.1–11.9)

## 2024-08-25 ENCOUNTER — Ambulatory Visit: Admitting: Obstetrics and Gynecology

## 2024-08-25 ENCOUNTER — Other Ambulatory Visit: Payer: Self-pay

## 2024-08-25 VITALS — BP 123/63 | HR 74 | Wt 210.0 lb

## 2024-08-25 DIAGNOSIS — O99282 Endocrine, nutritional and metabolic diseases complicating pregnancy, second trimester: Secondary | ICD-10-CM | POA: Diagnosis not present

## 2024-08-25 DIAGNOSIS — O219 Vomiting of pregnancy, unspecified: Secondary | ICD-10-CM

## 2024-08-25 DIAGNOSIS — O0990 Supervision of high risk pregnancy, unspecified, unspecified trimester: Secondary | ICD-10-CM

## 2024-08-25 DIAGNOSIS — Z3A16 16 weeks gestation of pregnancy: Secondary | ICD-10-CM

## 2024-08-25 DIAGNOSIS — E0590 Thyrotoxicosis, unspecified without thyrotoxic crisis or storm: Secondary | ICD-10-CM

## 2024-08-25 DIAGNOSIS — O0992 Supervision of high risk pregnancy, unspecified, second trimester: Secondary | ICD-10-CM

## 2024-08-25 MED ORDER — PROMETHAZINE HCL 25 MG PO TABS: 25.0000 mg | ORAL_TABLET | Freq: Four times a day (QID) | ORAL | 2 refills | Status: AC | PRN

## 2024-08-25 NOTE — Progress Notes (Signed)
" ° °  PRENATAL VISIT NOTE  Subjective:  Margaret Goodman is a 29 y.o. [id number] at [redacted]w[redacted]d being seen today for ongoing prenatal care.  She is currently monitored for the following issues for this high-risk pregnancy and has Vitamin D  deficiency; Hyperthyroidism in pregnancy, antepartum; Family historic risk of congenital abnormality; HPV in female; and Supervision of high risk pregnancy, antepartum on their problem list.  Patient doing well with no acute concerns today. She reports nausea and vomiting.  Contractions: Not present. Vag. Bleeding: None.   . Denies leaking of fluid.   The following portions of the patient's history were reviewed and updated as appropriate: allergies, current medications, past family history, past medical history, past social history, past surgical history and problem list. Problem list updated.  Objective:   Vitals:   08/25/24 1452  BP: 123/63  Pulse: 74  Weight: 210 lb (95.3 kg)    Fetal Status: Fetal Heart Rate (bpm): 154 Fundal Height: 16 cm       General:  Alert, oriented and cooperative. Patient is in no acute distress.  Skin: Skin is warm and dry. No rash noted.   Cardiovascular: Normal heart rate noted  Respiratory: Normal respiratory effort, no problems with respiration noted  Abdomen: Soft, gravid, appropriate for gestational age.  Pain/Pressure: Present     Pelvic: Cervical exam deferred        Extremities: Normal range of motion.  Edema: Trace  Mental Status:  Normal mood and affect. Normal behavior. Normal judgment and thought content.   Assessment and Plan:  Pregnancy: [id number] at [redacted]w[redacted]d  1. [redacted] weeks gestation of pregnancy (Primary)   2. Hyperthyroidism in pregnancy, antepartum Reviewed chart, TSH low , but T3 and T4 were normal.  Pt is followed by [facility] endocrinology.  They advised continued surveillance for now without medication, they will reassess  3. Supervision of high risk pregnancy, antepartum Continue routine prenatal care - AFP, Serum,  Open Spina Bifida  4. Nausea/vomiting pregnancy: Pt requested refill of phenergan  for nausea, rx sent with 2 refills.  Preterm labor symptoms and general obstetric precautions including but not limited to vaginal bleeding, contractions, leaking of fluid and fetal movement were reviewed in detail with the patient.  Please refer to After Visit Summary for other counseling recommendations.   Return in about 4 weeks (around 09/22/2024) for [facility], in person.   Jerilynn Buddle, MD Faculty Attending Center for [facility]   "

## 2024-08-27 LAB — AFP, SERUM, OPEN SPINA BIFIDA
AFP MoM: 0.98
AFP Value: 32.3 ng/mL
Gest. Age on Collection Date: 16.9 wk
Maternal Age At EDD: 29.3 a
OSBR Risk 1 IN: 10000
Test Results:: NEGATIVE
Weight: 210 [lb_av]

## 2024-08-29 ENCOUNTER — Ambulatory Visit: Payer: Self-pay | Admitting: Obstetrics and Gynecology

## 2024-09-11 ENCOUNTER — Ambulatory Visit: Admitting: Internal Medicine

## 2024-09-11 ENCOUNTER — Ambulatory Visit: Attending: Obstetrics and Gynecology | Admitting: Maternal & Fetal Medicine

## 2024-09-11 ENCOUNTER — Other Ambulatory Visit

## 2024-09-11 VITALS — BP 109/69 | HR 91

## 2024-09-11 DIAGNOSIS — E669 Obesity, unspecified: Secondary | ICD-10-CM

## 2024-09-11 DIAGNOSIS — O99282 Endocrine, nutritional and metabolic diseases complicating pregnancy, second trimester: Secondary | ICD-10-CM | POA: Diagnosis not present

## 2024-09-11 DIAGNOSIS — O321XX0 Maternal care for breech presentation, not applicable or unspecified: Secondary | ICD-10-CM | POA: Diagnosis present

## 2024-09-11 DIAGNOSIS — Z3A19 19 weeks gestation of pregnancy: Secondary | ICD-10-CM | POA: Diagnosis not present

## 2024-09-11 DIAGNOSIS — O99212 Obesity complicating pregnancy, second trimester: Secondary | ICD-10-CM

## 2024-09-11 DIAGNOSIS — O0990 Supervision of high risk pregnancy, unspecified, unspecified trimester: Secondary | ICD-10-CM

## 2024-09-11 DIAGNOSIS — O99280 Endocrine, nutritional and metabolic diseases complicating pregnancy, unspecified trimester: Secondary | ICD-10-CM

## 2024-09-11 DIAGNOSIS — O0992 Supervision of high risk pregnancy, unspecified, second trimester: Secondary | ICD-10-CM | POA: Insufficient documentation

## 2024-09-11 DIAGNOSIS — E0590 Thyrotoxicosis, unspecified without thyrotoxic crisis or storm: Secondary | ICD-10-CM

## 2024-09-11 DIAGNOSIS — Z3689 Encounter for other specified antenatal screening: Secondary | ICD-10-CM | POA: Insufficient documentation

## 2024-09-11 NOTE — Progress Notes (Signed)
 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 H4E7977 at [redacted]w[redacted]d here for ultrasound and consultation. Margaret Goodman is doing well today with no acute concerns. She had low risk NIPT of a female fetus. Today we focused on the following:   The patient is here today for an anatomy ultrasound. She has a history of two prior term live births, both uncomplicated vaginal deliveries. She has also had two prior pregnancy terminations, one elective and one for fetal Dandy-Walker malformation. We discussed that the recurrence risk for Dandy-Walker malformation is low, and todays detailed anatomic ultrasound demonstrates no evidence of posterior fossa abnormalities or Dandy-Walker malformation.  The pregnancy is additionally notable for a history of hyperthyroidism that the patient reports occurs only during pregnancy. We discussed that early in pregnancy, TSH can be physiologically suppressed due to hCG-mediated stimulation of the thyroid , which is a normal finding. Interpretation of thyroid  function tests in pregnancy therefore requires assessment of free T4 and total T3 in addition to TSH. On her most recent evaluation, free T4 and total T3 were within normal limits. Based on these findings, I do not believe she has true hyperthyroidism at this time, but rather physiologic suppression of TSH associated with pregnancy.  We discussed that if she were to demonstrate an elevated free T4 or total T3, or an inappropriately suppressed  TSH beyond the first trimester, this could represent gestational transient biochemical thyrotoxicosis. This condition is often associated with hyperemesis and typically resolves spontaneously during pregnancy. Antithyroid medications are not indicated unless overt hyperthyroidism is confirmed, particularly outside of the first trimester.  We reviewed the potential maternal and fetal complications associated with hyperthyroidism in pregnancy and emphasized that the degree of risk is directly proportional to the level of disease control. At this time, her clinical and laboratory findings are most consistent with gestational hyperthyroidism, which is generally associated with favorable pregnancy outcomes.  She was counseled to monitor for symptoms suggestive of uncontrolled thyroid  disease, including tachycardia, persistent nausea or vomiting, tremor, heat intolerance, or neurologic symptoms. If not previously performed, thyroid  antibody testing is recommended, particularly thyroid -stimulating immunoglobulins, as approximately 95% of patients with Graves disease have detectable thyroid -stimulating antibodies. Identification of these antibodies would help confirm the diagnosis and assess fetal risk, as neonatal hyperthyroidism occurs only when maternal thyroid -stimulating antibodies cross the placenta. The patient previously took low-dose aspirin (81 mg daily).  Recommendations -Reassure patient regarding low recurrence risk of Dandy-Walker malformation and normal findings on todays anatomy ultrasound -Continue routine prenatal care with serial thyroid  function testing (TSH, free T4, total T3) every 4-6 weeks until stable. Target free T4 and total T3 in the upper limit of normal or mildly above normal during pregnancy. Avoid antithyroid medications unless overt hyperthyroidism is confirmed, particularly outside of the first trimester -Obtain thyroid  antibody testing, including thyroid -stimulating  immunoglobulins, if not previously performed -Counsel patient to monitor for symptoms of uncontrolled thyroid  disease, including tachycardia, tremor, heat intolerance, persistent nausea or vomiting, or neurologic symptoms -Continue low-dose aspirin (81 mg daily)  during pregnancy unless contraindicated -28 week growth US  due to possible hyperthyroidism  The patient had time to ask questions that were answered to her satisfaction.  She verbalized understanding and agrees to proceed with the plan above.   I spent 60 minutes reviewing the patients chart, including labs and images as well as counseling the patient about her medical conditions. Greater than 50% of the time was spent in direct face-to-face patient counseling.  Margaret Goodman  MFM, [place]   09/11/2024  9:50 AM   Review of Systems: A review of systems was performed and was negative except per HPI   Vitals and Physical Exam    09/11/2024    8:07 AM 08/25/2024    2:52 PM 07/21/2024   10:24 AM  Vitals with BMI  Weight  210 lbs 208 lbs 5 oz  BMI   32.62  Systolic 109 123 883  Diastolic 69 63 64  Pulse 91 74 75    Sitting comfortably on the sonogram table Nonlabored breathing Normal rate and rhythm Abdomen is nontender  Past pregnancies OB History  Gravida Para Term Preterm AB Living  5 2 2  2 2   SAB IAB Ectopic Multiple Live Births  0 2  0 2    # Outcome Date GA Lbr Len/2nd Weight Sex Type Anes PTL Lv  5 Current           4 Term 05/19/22 [redacted]w[redacted]d 12:01 / 00:08 7 lb 1.2 oz (3.21 kg) M Vag-Spont EPI  LIV  3 IAB 2022       N      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  2 IAB 2020          1 Term 05/19/18 [redacted]w[redacted]d 18:36 / 01:53 7 lb 5.1 oz (3.32 kg) M Vag-Spont EPI  LIV     Future Appointments  Date Time Provider Department Center  09/12/2024  8:30 AM Shamleffer, Donell Cardinal, MD [facility] None  09/26/2024 11:15 AM Ilean Norleen GAILS, MD [facility]  10/23/2024 10:55 AM Herchel, Gloris LABOR, MD [facility]  [facility]  11/20/2024  8:20 AM [facility] LAB [facility]  11/20/2024  8:55 AM [facility]GENER[facility] 1 [facility]  12/04/2024 11:15 AM [facility] 1 [facility]  12/18/2024 10:55 AM [facility]GENERAL 1 [facility]  01/01/2025 10:55 AM [facility]GENERAL 1 [facility]  01/15/2025 10:55 AM [facility]-GENERAL 1 [facility]  01/22/2025 10:55 AM [facility]GENERAL 1 [facility]  01/29/2025 10:55 AM [facility]-GENERAL 1 [facility]  02/05/2025 11:15 AM [facility]GENERAL 1 [facility]      "

## 2024-09-11 NOTE — Progress Notes (Signed)
 "  Name: Margaret Goodman  MRN/ DOB: [id number], Mar 11, 1996    Age/ Sex: 29 y.o., female     PCP: Vernon Velna SAUNDERS, MD   Reason for Endocrinology Evaluation: Hyperthyroidism     Initial Endocrinology Clinic Visit: 10/20/2021    PATIENT IDENTIFIER: Margaret Goodman is a 29 y.o., female with a past medical history of hyperthyroidism. She has followed with [place] [facility] since 10/20/2021 for consultative assistance with management of her Hyperthyroidism.     HISTORICAL SUMMARY: The patient was first diagnosed with hyperthyroidism in 02/2021.  She was initially on methimazole  but this was switched to PTU during pregnancy 09/2021 and was switched back to methimazole  during her second trimester in May 2023 during her second pregnancy which ended up with miscarriage. Her TFT's normalized after that until her 3rd pregnancy    No Fh of thyroid  disease   She is s/p delivery of baby boy in 04/2022-[place]   She has been off methimazole  since June, 2023  SUBJECTIVE:    Today (09/12/2024):  [name] is here for follow-up follow-up on hyperthyroidism .   She is at 19.3 weeks of gestation. She is G5P2 EDD 02/03/2025 LMP 04/29/2024  No local neck swelling  Has occasional palpitations  No tremors  No loose stools or diarrhea  No jiterry sensation No eye symptoms      HISTORY:  Past Medical History:  Past Medical History:  Diagnosis Date   Depression    dr had conversation today about therapy before medicine   Hyperlipidemia    Hyperthyroidism    Hypotension    UTI (urinary tract infection)    Past Surgical History:  Past Surgical History:  Procedure Laterality Date   DILATION AND CURETTAGE OF UTERUS  2022   Social History:  reports that she has never smoked. She has never used smokeless tobacco. She reports that she does not currently use alcohol. She reports that she does not use drugs. Family History:  Family History  Problem Relation Age of Onset   Healthy Mother     Healthy Father    Thyroid  disease Neg Hx      HOME MEDICATIONS: Allergies as of 09/12/2024       Reactions   Tamiflu [oseltamivir Phosphate] Hives        Medication List        Accurate as of September 12, 2024  8:33 AM. If you have any questions, ask your nurse or doctor.          hydrOXYzine 25 MG tablet Commonly known as: ATARAX Take 25 mg by mouth daily as needed.   metoCLOPramide  10 MG tablet Commonly known as: REGLAN  Take 1 tablet (10 mg total) by mouth every 8 (eight) hours.   ondansetron  4 MG tablet Commonly known as: ZOFRAN  Take 4 mg by mouth every 8 (eight) hours as needed for nausea or vomiting.   pantoprazole  40 MG tablet Commonly known as: PROTONIX  TAKE 1 TABLET(40 MG) BY MOUTH DAILY   promethazine  25 MG tablet Commonly known as: PHENERGAN  Take 1 tablet (25 mg total) by mouth every 6 (six) hours as needed for nausea or vomiting.   scopolamine  1 MG/3DAYS Commonly known as: TRANSDERM-SCOP Place 1 patch onto the skin every 3 (three) days.   sertraline  25 MG tablet Commonly known as: ZOLOFT  Take 2 tablets (50 mg total) by mouth daily.          OBJECTIVE:   PHYSICAL EXAM: VS:LMP 04/29/2024 (Exact Date)    EXAM: General:  Pt appears well and is in NAD  Eyes: External eye exam normal without stare, lid lag or exophthalmos.   Neck: General: Supple without adenopathy. Thyroid : Thyroid  size normal.  No goiter or nodules appreciated.   Lungs: Clear with good BS bilat   Heart: Auscultation: RRR.  Extremities:  BL LE: No pretibial edema   Mental Status: Judgment, insight: Intact Orientation: Oriented to time, place, and person Mood and affect: No depression, anxiety, or agitation     DATA REVIEWED:   Latest Reference Range & Units 09/12/24 08:49  TSH mIU/L 0.62  Triiodothyronine (T3) 76 - 181 ng/dL 822  Thyroxine (T4) 5.1 - 11.9 mcg/dL 89.1     88/88/7974 FT4 0.75 (0.61-1.12) TSH 0.14 ( 0.34-4.5)    ASSESSMENT / PLAN /  RECOMMENDATIONS:   1.Hyperthyroidism :  -Patient is clinically euthyroid -No local neck symptoms -Historically we have had high suspicion for Graves' disease, TRAb has been undetectable in the past  -She has been off thionamide therapy since June 2023 -The goal of treatment is to maintain persistent but mild hyperthyroidism in the mother in an attempt to prevent fetal hypothyroidism since the fetal thyroid  is more sensitive to the action of thionamide therapy. Overtreatment of maternal hyperthyroidism can cause fetal goiter and primary hypothyroidism.  - TFTs remain normal - TRAb pending   Follow-up in 3 months  Labs in ~ 6 weeks    Signed electronically by: Stefano Redgie Butts, MD  [facility]  [facility] [address] [place], [place] [address] Phone: [phone] FAX: [phone]      CC: Vernon Velna SAUNDERS, MD [address] [place] [address] Phone: [phone]  Fax: [phone]   Return to Endocrinology clinic as below: Future Appointments  Date Time Provider Department Center  09/26/2024 11:15 AM Ilean Norleen GAILS, MD [facility]  10/23/2024 10:55 AM Herchel Gloris LABOR, MD [facility]  11/13/2024  8:15 AM [facility] PROVIDER 1 [facility]  11/13/2024  8:30 AM [facility] US2 [facility]  11/20/2024  8:20 AM [facility] LAB [facility]  11/20/2024  8:55 AM [facility]-GENERAL 1 [facility]  12/04/2024 11:15 AM W[facility]GENERAL 1 [facility]  12/18/2024 10:55 AM [facility]GENERAL 1 [facility]  01/01/2025 10:55 AM [facility]GENERAL 1 [facility]  01/15/2025 10:55 AM W[facility]-GENERAL 1 [facility]  01/22/2025 10:55 AM WMC-GENERAL 1 [facility]  01/29/2025 10:55 AM [facility]GENERAL 1 [facility]  02/05/2025 11:15 AM [facility]GENERAL 1 [facility]    "

## 2024-09-12 ENCOUNTER — Encounter: Payer: Self-pay | Admitting: Internal Medicine

## 2024-09-12 ENCOUNTER — Other Ambulatory Visit

## 2024-09-12 ENCOUNTER — Ambulatory Visit: Admitting: Internal Medicine

## 2024-09-12 VITALS — BP 110/68 | HR 75 | Ht 67.0 in | Wt 218.0 lb

## 2024-09-12 DIAGNOSIS — E0590 Thyrotoxicosis, unspecified without thyrotoxic crisis or storm: Secondary | ICD-10-CM

## 2024-09-16 ENCOUNTER — Ambulatory Visit: Payer: Self-pay | Admitting: Internal Medicine

## 2024-09-16 LAB — TRAB (TSH RECEPTOR BINDING ANTIBODY): TRAB: <1 [IU]/L

## 2024-09-16 LAB — T4: T4, Total: 10.8 ug/dL (ref 5.1–11.9)

## 2024-09-16 LAB — T3: T3, Total: 177 ng/dL (ref 76–181)

## 2024-09-16 LAB — TSH: TSH: 0.62 m[IU]/L

## 2024-09-26 ENCOUNTER — Ambulatory Visit: Payer: Self-pay | Admitting: Family Medicine

## 2024-09-26 ENCOUNTER — Other Ambulatory Visit: Payer: Self-pay

## 2024-09-26 VITALS — BP 122/74 | HR 87 | Wt 220.0 lb

## 2024-09-26 DIAGNOSIS — O219 Vomiting of pregnancy, unspecified: Secondary | ICD-10-CM

## 2024-09-26 DIAGNOSIS — E0590 Thyrotoxicosis, unspecified without thyrotoxic crisis or storm: Secondary | ICD-10-CM

## 2024-09-26 DIAGNOSIS — O0990 Supervision of high risk pregnancy, unspecified, unspecified trimester: Secondary | ICD-10-CM

## 2024-09-26 DIAGNOSIS — Z3A21 21 weeks gestation of pregnancy: Secondary | ICD-10-CM

## 2024-09-26 NOTE — Progress Notes (Signed)
 "  PRENATAL VISIT NOTE  Subjective:  Margaret Goodman is a 29 y.o. H4E7977 at [redacted]w[redacted]d being seen today for ongoing prenatal care.  She is currently monitored for the following issues for this high-risk pregnancy and has Vitamin D  deficiency; Hyperthyroidism in pregnancy, antepartum; Family historic risk of congenital abnormality; HPV in female; and Supervision of high risk pregnancy, antepartum on their problem list.  Patient reports no bleeding, no contractions, no cramping, and no leaking.   .  .   . Denies leaking of fluid.   The following portions of the patient's history were reviewed and updated as appropriate: allergies, current medications, past family history, past medical history, past social history, past surgical history and problem list.   Objective:   There were no vitals filed for this visit.  Fetal Status:           General: Alert, oriented and cooperative. Patient is in no acute distress.  Skin: Skin is warm and dry. No rash noted.   Cardiovascular: Normal heart rate noted  Respiratory: Normal respiratory effort, no problems with respiration noted  Abdomen: Soft, gravid, appropriate for gestational age.        Pelvic: Cervical exam deferred        Extremities: Normal range of motion.     Mental Status: Normal mood and affect. Normal behavior. Normal judgment and thought content.      07/21/2024   11:01 AM 05/08/2022   10:52 AM 05/01/2022   10:43 AM  Depression screen PHQ 2/9  Decreased Interest 2 0 0  Down, Depressed, Hopeless 1 0 0  PHQ - 2 Score 3 0 0  Altered sleeping 2 1 2   Tired, decreased energy 2 1 1   Change in appetite 2 0 0  Feeling bad or failure about yourself  0 0 0  Trouble concentrating 1 0 0  Moving slowly or fidgety/restless 0 0 0  Suicidal thoughts 0 0 0  PHQ-9 Score 10 2  3    Difficult doing work/chores  Not difficult at all      Data saved with a previous flowsheet row definition        07/21/2024   11:01 AM 05/08/2022   10:52 AM 05/01/2022    10:43 AM 03/24/2022   11:28 AM  GAD 7 : Generalized Anxiety Score  Nervous, Anxious, on Edge 1  0  0  0   Control/stop worrying 0  0  0  0   Worry too much - different things 1  0  0  0   Trouble relaxing 1  0  0  0   Restless 0  0  0  0   Easily annoyed or irritable 2  0  0  1   Afraid - awful might happen 0  0  0  0   Total GAD 7 Score 5 0 0 1     Data saved with a previous flowsheet row definition    Assessment and Plan:  Pregnancy: [id number] at [redacted]w[redacted]d 1. Supervision of high risk pregnancy, antepartum FHR and BP appropriate   2. Hyperthyroidism in pregnancy, antepartum Recent labs WNL  On no medications Follows with Endo   3. Pregnancy related nausea and vomiting, antepartum  4. [redacted] weeks gestation of pregnancy (Primary)   Preterm labor symptoms and general obstetric precautions including but not limited to vaginal bleeding, contractions, leaking of fluid and fetal movement were reviewed in detail with the patient. Please refer to After Visit Summary for other counseling  recommendations.   No follow-ups on file.  Future Appointments  Date Time Provider Department Center  10/23/2024 10:55 AM Anyanwu, Gloris LABOR, MD [facility]  10/24/2024  9:30 AM LB ENDO/NEURO LAB [facility] None  11/13/2024  8:15 AM [facility] PROVIDER 1 [facility]  11/13/2024  8:30 AM [facility] US2 [facility]  11/20/2024  8:20 AM [facility] LAB [facility]  11/20/2024  8:55 AM Nicholaus Burnard HERO, MD [facility]  12/04/2024 11:15 AM Davis, Devon E, PA-C [facility]  12/12/2024  9:30 AM Shamleffer, Donell Cardinal, MD [facility] None  12/18/2024 10:55 AM WMC-GENERAL 1 [facility]  01/01/2025 10:55 AM WMC-GENERAL 1 [facility]  01/15/2025 10:55 AM WMC-GENERAL 1 [facility]  01/22/2025 10:55 AM WMC-GENERAL 1 [facility]  01/29/2025 10:55 AM WMC-GENERAL 1 [facility]  02/05/2025 11:15 AM WMC-GENERAL 1 [facility]    Norleen LULLA Rover, MD  "

## 2024-10-23 ENCOUNTER — Encounter: Payer: Self-pay | Admitting: Obstetrics & Gynecology

## 2024-10-24 ENCOUNTER — Other Ambulatory Visit

## 2024-11-13 ENCOUNTER — Ambulatory Visit

## 2024-11-20 ENCOUNTER — Encounter: Payer: Self-pay | Admitting: Obstetrics and Gynecology

## 2024-11-20 ENCOUNTER — Other Ambulatory Visit: Payer: Self-pay

## 2024-12-04 ENCOUNTER — Encounter: Payer: Self-pay | Admitting: Physician Assistant

## 2024-12-12 ENCOUNTER — Ambulatory Visit: Admitting: Internal Medicine

## 2025-02-03 ENCOUNTER — Inpatient Hospital Stay (HOSPITAL_COMMUNITY): Admit: 2025-02-03
